# Patient Record
Sex: Male | Born: 1950 | Hispanic: No | Marital: Married | Smoking: Never smoker
Health system: Southern US, Community
[De-identification: ages and names within clinical notes are randomized; demographics above are authoritative.]

## PROBLEM LIST (undated history)

## (undated) DIAGNOSIS — C801 Malignant (primary) neoplasm, unspecified: Secondary | ICD-10-CM

## (undated) DIAGNOSIS — Z9889 Other specified postprocedural states: Secondary | ICD-10-CM

## (undated) DIAGNOSIS — M109 Gout, unspecified: Secondary | ICD-10-CM

## (undated) DIAGNOSIS — R9431 Abnormal electrocardiogram [ECG] [EKG]: Secondary | ICD-10-CM

## (undated) DIAGNOSIS — K8689 Other specified diseases of pancreas: Secondary | ICD-10-CM

## (undated) DIAGNOSIS — R0600 Dyspnea, unspecified: Secondary | ICD-10-CM

## (undated) DIAGNOSIS — T8149XA Infection following a procedure, other surgical site, initial encounter: Secondary | ICD-10-CM

## (undated) DIAGNOSIS — E440 Moderate protein-calorie malnutrition: Secondary | ICD-10-CM

## (undated) DIAGNOSIS — M1712 Unilateral primary osteoarthritis, left knee: Secondary | ICD-10-CM

## (undated) DIAGNOSIS — E7800 Pure hypercholesterolemia, unspecified: Secondary | ICD-10-CM

## (undated) DIAGNOSIS — K831 Obstruction of bile duct: Secondary | ICD-10-CM

## (undated) DIAGNOSIS — I4892 Unspecified atrial flutter: Secondary | ICD-10-CM

## (undated) DIAGNOSIS — Z8679 Personal history of other diseases of the circulatory system: Secondary | ICD-10-CM

## (undated) DIAGNOSIS — R0609 Other forms of dyspnea: Secondary | ICD-10-CM

## (undated) HISTORY — DX: Other specified postprocedural states: Z98.890

## (undated) HISTORY — DX: Dyspnea, unspecified: R06.00

## (undated) HISTORY — DX: Unilateral primary osteoarthritis, left knee: M17.12

## (undated) HISTORY — DX: Other forms of dyspnea: R06.09

## (undated) HISTORY — PX: PANCREAS SURGERY: SHX731

## (undated) HISTORY — DX: Abnormal electrocardiogram (ECG) (EKG): R94.31

## (undated) HISTORY — PX: OTHER SURGICAL HISTORY: SHX169

## (undated) HISTORY — PX: ERCP W/ SPHICTEROTOMY: SHX1523

## (undated) HISTORY — DX: Obstruction of bile duct: K83.1

## (undated) HISTORY — DX: Gout, unspecified: M10.9

## (undated) HISTORY — DX: Pure hypercholesterolemia, unspecified: E78.00

## (undated) HISTORY — DX: Infection following a procedure, other surgical site, initial encounter: T81.49XA

## (undated) HISTORY — DX: Unspecified atrial flutter: I48.92

## (undated) HISTORY — PX: PARTIAL GASTRECTOMY: SHX2172

## (undated) HISTORY — DX: Malignant (primary) neoplasm, unspecified: C80.1

## (undated) HISTORY — DX: Moderate protein-calorie malnutrition: E44.0

## (undated) HISTORY — DX: Personal history of other diseases of the circulatory system: Z86.79

## (undated) HISTORY — DX: Other specified diseases of pancreas: K86.89

---

## 2007-12-18 HISTORY — PX: ATRIAL FLUTTER ABLATION: SHX5733

## 2008-01-01 ENCOUNTER — Encounter: Admission: RE | Admit: 2008-01-01 | Discharge: 2008-01-01 | Payer: Self-pay | Admitting: Internal Medicine

## 2008-01-04 ENCOUNTER — Ambulatory Visit: Payer: Self-pay | Admitting: Internal Medicine

## 2008-01-04 LAB — CONVERTED CEMR LAB
Eosinophils Absolute: 0.1 10*3/uL (ref 0.0–0.7)
GFR calc Af Amer: 81 mL/min
GFR calc non Af Amer: 67 mL/min
HCT: 48.9 % (ref 39.0–52.0)
INR: 1 (ref 0.8–1.0)
MCHC: 33 g/dL (ref 30.0–36.0)
MCV: 90.3 fL (ref 78.0–100.0)
Monocytes Absolute: 0.9 10*3/uL (ref 0.1–1.0)
Platelets: 279 10*3/uL (ref 150–400)
Potassium: 4.4 meq/L (ref 3.5–5.1)
RDW: 13.3 % (ref 11.5–14.6)
Sodium: 140 meq/L (ref 135–145)

## 2008-01-15 ENCOUNTER — Observation Stay (HOSPITAL_COMMUNITY): Admission: RE | Admit: 2008-01-15 | Discharge: 2008-01-16 | Payer: Self-pay | Admitting: Internal Medicine

## 2008-01-15 ENCOUNTER — Encounter: Payer: Self-pay | Admitting: Cardiology

## 2008-01-15 ENCOUNTER — Ambulatory Visit: Payer: Self-pay | Admitting: Internal Medicine

## 2008-02-01 ENCOUNTER — Ambulatory Visit: Payer: Self-pay

## 2008-02-01 ENCOUNTER — Encounter: Payer: Self-pay | Admitting: Internal Medicine

## 2008-02-08 ENCOUNTER — Ambulatory Visit: Payer: Self-pay | Admitting: Internal Medicine

## 2008-06-23 ENCOUNTER — Ambulatory Visit: Payer: Self-pay | Admitting: Internal Medicine

## 2009-10-02 ENCOUNTER — Ambulatory Visit: Payer: Self-pay | Admitting: Internal Medicine

## 2011-01-01 NOTE — Discharge Summary (Signed)
Evan Thomas, Evan Thomas NO.:  [id number]   MEDICAL RECORD NO.:  [id number]          PATIENT TYPE:  INP   LOCATION:  6531                         FACILITY:  [facility]   PHYSICIAN:  Doylene Canning. Ladona Ridgel, MD    DATE OF BIRTH:  [birth year]   DATE OF ADMISSION:  01/15/2008  DATE OF DISCHARGE:  01/16/2008                               DISCHARGE SUMMARY   FINAL DIAGNOSES:  1. Discharging day #1 status post electrophysiology study,      radiofrequency, catheter ablation of atypical atrial flutter.  2. Possible tachycardia-mediated cardiomyopathy.   SECONDARY DIAGNOSIS:  Gout.   PROCEDURE:  1. Jan 15, 2008, transesophageal echocardiogram by Dr. Nona Dell.  The study showed ejection fraction 20-25%.  This was      possibly artificially low.  We will recheck in 2-3 weeks.  There is      an evidence of a small PFO.  There was no left atrial appendage      thrombus.  2. Jan 15, 2008, electrophysiology study, radiofrequency catheter      ablation of a typical atrial flutter with creation of a      cavotricuspid isthmus bidirectional block by Dr. Lewayne Bunting.   BRIEF HISTORY:  Evan Thomas is a 60 year old male who is referred by Dr.  Luanna Cole. Thomas for evaluation of atrial flutter.  He has noted lately  some exercise difficulties, but did not have incredible symptoms.  He  does feel at times as if his heart has been racing at a routine physical  examination.  Electrocardiogram showed atypical atrial flutter.  The  patient has had no chest pain or chest discomfort and no syncope.  The  patient denies peripheral edema.   HOSPITAL COURSE:  The patient presents electively on Jan 15, 2008.  The  patient has not been anticoagulated, and therefore, he underwent  transesophageal echocardiogram.  This study showed ejection fraction of  20-25%.  No left atrial appendage thrombus.  Evidence of small  PFO.   PLAN:  Plan going forward would be to reassess 2-D echocardiogram in 2-3  weeks and then schedule a followup appointment with Dr. Lewayne Bunting.  The patient discharging on enteric-coated aspirin 325 mg daily.  This is  new medication for him.  He is on no other medications.  If possible, he  will require Coreg in the future.  However, followup is at [facility], [address].  1. Echocardiogram, Monday June 15 at 8:30, and he will see Dr. Ladona Ridgel,      Monday, June 22, at 4:30.   LABORATORY STUDIES:  Since admission, complete blood count, hemoglobin  15.1, hematocrit 45.4, white cells of 6.5, and platelets of 268.  Serum  electrolytes, sodium 139, potassium is 4, chloride 109, carbonate 23,  BUN is 14, creatinine 1.15, and glucose is 109.  Protime has not been  taken.  The patient is not on Coumadin.      Maple Mirza, PA      Doylene Canning. Ladona Ridgel, MD  Electronically Signed    GM/MEDQ  D:  01/15/2008  T:  01/16/2008  Job:  [id number]   cc:   Evan Thomas, M.D.

## 2011-01-01 NOTE — Assessment & Plan Note (Signed)
[facility]                         ELECTROPHYSIOLOGY OFFICE NOTE   Evan, Thomas                       MRN:          [id number]  DATE:02/08/2008                            DOB:          Mar 15, 1951    Evan Thomas returns today for followup.  He is a very pleasant middle-  aged male with a history of atrial flutter, status post ablation several  weeks ago.  At that time, he had a persistent cough and dyspnea and TEE  demonstrated what appeared to be tachycardia-induced cardiomyopathy.  After successful ablation, the patient was discharged home.  His cough  soon resolved and repeat 2-D echo, which was done several days ago,  demonstrates normalization of his left ventricular systolic function.  He was noted to have mild left atrial dilatation and atrial septal  aneurysm.  He returns today for followup.  He denies chest pain.  He  denies shortness of breath and overall feels well.  He denies  palpitations.  He currently takes an aspirin 325 a day.   PHYSICAL EXAMINATION:  GENERAL:  He is a pleasant well-appearing middle-  aged man in no distress.  VITAL SIGNS:  Blood pressure was 130/96, the pulse 85 and regular, the  respirations were 18, and the weight was 225 pounds.  NECK:  No jugular distention.  LUNGS:  Clear bilaterally to auscultation.  No wheezes, rales, or  rhonchi were present.  CARDIOVASCULAR:  Regular rate and rhythm.  Normal S1 and S2.  No  murmurs, rubs, or gallops.  EXTREMITIES:  No edema.   EKG demonstrates sinus rhythm with normal axis intervals.   IMPRESSION:  1. Symptomatic atrial flutter.  2. Status post ablation.  3. Tachycardia-induced cardiomyopathy, now resolved.   DISCUSSION:  I have recommended careful, watchful waiting for Mr.  Thomas.  Overall, he is doing well.  Obviously, if his blood pressure  remains elevated, I would ask him to see his primary physician, Dr.  Lenord Thomas back in followup.  I will not plan to  see him back in the office  unless he has recurrence of his atrial arrhythmias.  I think probably  maintenance of aspirin at least in the short term would be warranted.     Doylene Canning. Ladona Ridgel, MD  Electronically Signed    GWT/MedQ  DD: 02/08/2008  DT: 02/09/2008  Job #: [id number]   cc:   Evan Thomas, M.D.

## 2011-01-01 NOTE — Letter (Signed)
Jan 04, 2008    Luanna Cole. Lenord Fellers, M.D.  [address], [place]  [place], [place] [address]   RE:  Evan Thomas, Evan Thomas  MRN:  [id number]  /  DOB:  July 08, 1951   Dear [name]:   Thank you for referring [name] for EP evaluation.  As you  know, he is a very pleasant middle-aged man who was recently found to  have atrial flutter with very mild symptoms despite a very rapid  ventricular rate.  His exam has been well characterized as has his  family history, social history, and review of systems.   I have discussed treatment options with the patient today.  The risks,  benefits, goals, and expectations of catheter ablation of his atrial  flutter have been discussed and he would like to proceed with this.  We  will get this scheduled with a transesophageal echo done prior to this.  I will keep you apprised as to how he does.  Thanks for referring Mr.  Mukherjee for EP evaluation.    Sincerely,      Doylene Canning. Ladona Ridgel, MD  Electronically Signed    GWT/MedQ  DD: 01/04/2008  DT: 01/04/2008  Job #: [id number]

## 2011-01-01 NOTE — Op Note (Signed)
NAMELONDELL, NOLL NO.:  [id number]   MEDICAL RECORD NO.:  [id number]          PATIENT TYPE:  INP   LOCATION:  2899                         FACILITY:  [facility]   PHYSICIAN:  Doylene Canning. Ladona Ridgel, MD    DATE OF BIRTH:  August 07, 1951   DATE OF PROCEDURE:  01/15/2008  DATE OF DISCHARGE:                               OPERATIVE REPORT   PROCEDURE PERFORMED:  Electrophysiologic studies and RF catheter  ablation of atrial flutter.   HISTORY:  The patient is a 60 year old male with a history of atrial  flutter diagnosed by Dr. Sharlet Salina and referred for evaluation.  He has no history of heart failure, hypertension, diabetes, or stroke  and is here now for catheter ablation of his atrial flutter.  Of note,  the patient's atrial flutter is not associated with palpitations, and  the patient had no idea that he was in atrial flutter with the exception  that he had some fatigue with exertion.   PROCEDURE:  After informed consent was obtained, the patient was taken  to the diagnostic EP lab in a fasting state.  After usual preparation  and draping, intravenous fentanyl and midazolam was given for sedation.  A 6-French hexapolar catheter was inserted percutaneously into the right  jugular vein under fluoroscopic guidance, and advanced to the coronary  sinus.  A 5-French quadripolar catheter was inserted percutaneously in  the right femoral vein and advanced to the His bundle region under  fluoroscopic guidance.  A 7-French, 20-pole halo catheter was inserted  percutaneously in the right femoral vein and advanced through the right  atrium under fluoroscopic guidance.  After measurement of the basic  intervals, mapping was carried out demonstrating typical  counterclockwise tricuspid annular reentrant atrial flutter.  Cycle  length of the flutter was 219 msec.  A 7-French quadripolar ablation  catheter was inserted into the right femoral vein and advanced into the  right atrium  where additional mapping was carried out.  Atrial flutter  isthmus was unusually large and anteriorly displaced.  A total of 8 RF  energy applications were delivered to the usual atrial flutter isthmus.  During the third RF energy application, atrial flutter was terminated,  and sinus rhythm was restored.  The patient underwent additional pacing  with demonstration of isthmus block during the fourth RF energy  application.  At this point, four bonus RF energy applications were  delivered, and the patient was observed for 45 minutes.  During this  time, there was no atrial flutter isthmus conduction with bidirectional  block persisting.  During this time, rapid ventricular pacing was  carried out from the RV apex demonstrating VA Wenckebach at 360 msec.  During rapid ventricular pacing, the atrial activation sequence was  midline and decremental.  Next, programmed ventricular stimulation was  carried out from the RV apex at a base drive cycle length of 540 msec.  This S1-S2 interval stepwise decreased from 400 msec down to 270 msec  where the retrograde AV node ERP was observed.  During programmed  ventricular stimulation, the atrial activation sequence was midline and  decremental.  Programmed atrial stimulation was carried out from the  coronary sinus at a base drive cycle length of 098 msec.  The S1-S2  interval was stepwise decreased down to less than 200 msec, where AV  node conduction persisted.  During programmed atrial stimulation, there  are no AH jumps, no echo beats, and no inducible SVT.  Rapid atrial  pacing was carried out from the coronary sinus down to 300 msec  demonstrating AV Wenckebach.  During rapid atrial pacing, the PR  interval was actually greater than the RR interval, but there was no  inducible SVT.  At this point, the catheters were removed, hemostasis  was assured, and the patient was returned to his room in satisfactory  condition.   COMPLICATIONS:  There  were no immediate complications.   RESULTS:  4A.  Baseline ECG.  The baseline ECG demonstrates atrial  flutter with rapid ventricular response.  4B.  Baseline intervals.  The HV interval was 58 msec.  QRS duration was  70 msec.  4C.  Rapid ventricular pacing.  Following ablation, rapid ventricular  pacing was carried out from the RV apex demonstrating a VA Wenckebach  cycle length of 360 msec.  During rapid ventricular pacing, the atrial  activation was midline and decremental.  4D.  Programmed ventricular stimulation.  Programmed ventricular  stimulation was carried out from the RV apex at base drive cycle length  of 119 msec.  The S1-S2 interval stepwise decreased down to 270 msec  where the retrograde AV node ERP was observed.  During programmed  ventricular stimulation, the atrial activation sequence was midline and  decremental.  4E.  Rapid atrial pacing.  The rapid atrial pacing was carried out  following ablation demonstrating an AV Wenckebach cycle length of 300  msec.  During rapid atrial pacing, there was no inducible arrhythmias.  Bidirectional block was demonstrated.  21F.  Programmed atrial stimulation.  Programmed atrial stimulation was  carried out from the coronary sinus and the high right atrium at base  drive cycle length of 147 msec.  The S1-S2 interval stepwise decreased  down to less than 200 msec where AV conduction persisted.  There is no  inducible SVT during programmed atrial stimulation.  4G.  Arrhythmias observed.  Atrial flutter initiation present at the time of EP study.  Duration was  sustained.  Termination was with catheter ablation, cycle length was 219  msec.  4H.  Mapping.  Mapping of atrial flutter isthmus demonstrated a large  atrial flutter isthmus, anteriorly displaced.  4I.  RF energy application.  A total of 8 RF energy applications were  delivered.  During the second RF application, there was termination of  flutter and restoration of sinus  rhythm.  During the fourth RF energy  application, there was isthmus block present.  Four bonus RF energy  applications were delivered.  .   CONCLUSION:  This demonstrates successful electrophysiologic study and  RF catheter ablation of typical atrial flutter with a total of 8 RF  energy applications resulting in termination of flutter, restoration of  sinus rhythm, creation of bidirectional block, and atrial flutter  isthmus.        Doylene Canning. Ladona Ridgel, MD  Electronically Signed     GWT/MEDQ  D:  01/15/2008  T:  01/16/2008  Job:  [id number]   cc:   Luanna Cole. Lenord Fellers, M.D.

## 2011-01-01 NOTE — Assessment & Plan Note (Signed)
[facility]                         ELECTROPHYSIOLOGY OFFICE NOTE   ANIELLO, CHRISTOPOULOS                       MRN:          [id number]  DATE:01/04/2008                            DOB:          1951/06/04    REFERRING PHYSICIAN:  Luanna Cole. Lenord Fellers, M.D.   [name] is referred today by Dr. Sharlet Salina for evaluation of  atrial flutter.  The patient is a very pleasant middle-aged male with a  history of palpitations for approximately 6 months.  He states that he  may have noted some increasing exercise difficulties but really had not  paid much attention to it.  He subsequently was found to have a rapid  heart rate, and an EKG demonstrates typical atrial flutter.  He has had  no chest pain and no syncope.  He is otherwise stable.  He denies  peripheral edema.   PAST MEDICAL HISTORY:  Notable for a very mild amount of gout.   He is on no medications.   SOCIAL HISTORY:  The patient recently has married, and he is from  [place] but lives in the [place].  He denies tobacco or ethanol  abuse.   FAMILY HISTORY:  Notable for his parents both being deceased, died in  their 59s, his father of an MI, his mother of cancer.   Additional review of systems is really unremarkable.  He has rare  palpitations.  He states that his energy level has gone down, and he  thinks that he is not particularly good condition physically.   PHYSICAL EXAM:  He is a pleasant well-appearing middle-aged man in no  acute distress.  The blood pressure today was 131/84.  The pulse was 110 and regular; the  respirations were 18, and the weight was 229 pounds.  HEENT:  Normocephalic, atraumatic.  Pupils equal and round.  Oropharynx  was moist.  Sclerae anicteric.  NECK:  No jugular distention.  There is no thyromegaly.  Trachea is  midline.  The carotids are 2+ symmetric.  LUNGS:  Clear bilaterally to auscultation.  No wheezes, rales or rhonchi  present.  There is no  increased work of breathing.  CARDIOVASCULAR:  Regular tachycardia with normal S1 and S2.  The PMI was  not enlarged; it was laterally displaced.  ABDOMINAL:  Soft, nontender.  There is no organomegaly.  Bowel sounds are present.  There is no  rebound or guarding.  EXTREMITIES:  No cyanosis, clubbing or edema.  The pulses were 2+ and  symmetric.  NEUROLOGIC:  Alert and oriented x3.  Cranial nerves intact.  Strength is  5/5 and symmetric.   EKG demonstrates atrial flutter with a 2:1 AV conduction.   IMPRESSION:  Typical atrial flutter (lone atrial flutter).   DISCUSSION:  I have discussed the treatment options with the patient.  The risks, benefits, goals, and expectations of electrophysiologic study  and catheter ablation have been discussed.  The patient will require  transesophageal echocardiogram to exclude a left atrial appendage  thrombus and probably aspirin thereafter.  I will plan on getting him  scheduled at his earliest possible convenient time.  Doylene Canning. Ladona Ridgel, MD  Electronically Signed    GWT/MedQ  DD: 01/04/2008  DT: 01/04/2008  Job #: [id number]   cc:   Luanna Cole. Lenord Fellers, M.D.

## 2011-05-15 LAB — PROTIME-INR
INR: 1
Prothrombin Time: 13.7

## 2011-05-15 LAB — BASIC METABOLIC PANEL
Calcium: 9
Creatinine, Ser: 1.15
GFR calc Af Amer: >60
GFR calc non Af Amer: >60
Glucose, Bld: 109 — ABNORMAL HIGH
Sodium: 139

## 2011-05-15 LAB — CBC
Hemoglobin: 15.1
RBC: 5.11
RDW: 14.3

## 2011-05-15 LAB — APTT: aPTT: 31

## 2012-06-26 ENCOUNTER — Other Ambulatory Visit: Payer: Self-pay | Admitting: Internal Medicine

## 2012-06-29 ENCOUNTER — Encounter: Payer: Self-pay | Admitting: Internal Medicine

## 2012-08-24 ENCOUNTER — Other Ambulatory Visit: Payer: Self-pay | Admitting: Internal Medicine

## 2012-08-24 DIAGNOSIS — Z0000 Encounter for general adult medical examination without abnormal findings: Secondary | ICD-10-CM

## 2012-08-24 DIAGNOSIS — Z125 Encounter for screening for malignant neoplasm of prostate: Secondary | ICD-10-CM

## 2012-08-24 LAB — COMPREHENSIVE METABOLIC PANEL
ALT: 19 U/L (ref 0–53)
AST: 19 U/L (ref 0–37)
Albumin: 4 g/dL (ref 3.5–5.2)
Alkaline Phosphatase: 70 U/L (ref 39–117)
Glucose, Bld: 92 mg/dL (ref 70–99)
Potassium: 4.2 mEq/L (ref 3.5–5.3)
Sodium: 140 mEq/L (ref 135–145)
Total Bilirubin: 0.5 mg/dL (ref 0.3–1.2)
Total Protein: 7 g/dL (ref 6.0–8.3)

## 2012-08-24 LAB — CBC WITH DIFFERENTIAL/PLATELET
Basophils Absolute: 0.1 10*3/uL (ref 0.0–0.1)
Basophils Relative: 1 % (ref 0–1)
Eosinophils Absolute: 0.2 10*3/uL (ref 0.0–0.7)
Hemoglobin: 15.2 g/dL (ref 13.0–17.0)
MCHC: 34.4 g/dL (ref 30.0–36.0)
Monocytes Relative: 13 % — ABNORMAL HIGH (ref 3–12)
Neutro Abs: 3.7 10*3/uL (ref 1.7–7.7)
Neutrophils Relative %: 56 % (ref 43–77)
Platelets: 275 10*3/uL (ref 150–400)

## 2012-08-24 LAB — PSA: PSA: 1.89 ng/mL (ref <=4.00)

## 2012-08-24 LAB — LIPID PANEL
LDL Cholesterol: 135 mg/dL — ABNORMAL HIGH (ref 0–99)
VLDL: 15 mg/dL (ref 0–40)

## 2012-08-25 ENCOUNTER — Encounter: Payer: Self-pay | Admitting: Internal Medicine

## 2012-08-25 ENCOUNTER — Ambulatory Visit (INDEPENDENT_AMBULATORY_CARE_PROVIDER_SITE_OTHER): Payer: BC Managed Care – PPO | Admitting: Internal Medicine

## 2012-08-25 VITALS — BP 118/82 | HR 80 | Temp 97.5°F | Ht 73.25 in | Wt 229.0 lb

## 2012-08-25 DIAGNOSIS — Z0000 Encounter for general adult medical examination without abnormal findings: Secondary | ICD-10-CM

## 2012-08-25 DIAGNOSIS — E785 Hyperlipidemia, unspecified: Secondary | ICD-10-CM

## 2012-08-25 DIAGNOSIS — M7710 Lateral epicondylitis, unspecified elbow: Secondary | ICD-10-CM

## 2012-08-25 DIAGNOSIS — M1712 Unilateral primary osteoarthritis, left knee: Secondary | ICD-10-CM

## 2012-08-25 DIAGNOSIS — Z8679 Personal history of other diseases of the circulatory system: Secondary | ICD-10-CM

## 2012-08-25 DIAGNOSIS — M7711 Lateral epicondylitis, right elbow: Secondary | ICD-10-CM

## 2012-08-25 DIAGNOSIS — Z9889 Other specified postprocedural states: Secondary | ICD-10-CM

## 2012-08-25 LAB — POCT URINALYSIS DIPSTICK
Glucose, UA: NEGATIVE
Nitrite, UA: NEGATIVE
Protein, UA: NEGATIVE
Spec Grav, UA: >=1.03
Urobilinogen, UA: NEGATIVE

## 2012-09-20 DIAGNOSIS — Z9889 Other specified postprocedural states: Secondary | ICD-10-CM

## 2012-09-20 DIAGNOSIS — Z8679 Personal history of other diseases of the circulatory system: Secondary | ICD-10-CM

## 2012-09-20 HISTORY — DX: Other specified postprocedural states: Z98.890

## 2012-09-20 HISTORY — DX: Personal history of other diseases of the circulatory system: Z86.79

## 2012-09-20 NOTE — Progress Notes (Signed)
  Subjective:    Patient ID: Evan Thomas, male    DOB: June 30, 1951, 62 y.o.   MRN: [id number]  HPI 62 year old [place] male married to Evan Thomas in today for health maintenance exam. History of atrial flutter with ablation May 2009. No known drug allergies. Fractured toe in the remote past. No other history of serious illnesses or accidents. Tetanus immunization given 2009.  Social history: One adult daughter from a previous marriage in good health. Patient does not smoke. Drinks wine. He is a Associate Professor. College education. Likes history and traveling.  Family history: Father died at age 63 of an MI. Mother died at age 86 of cancer. 2 brothers in good health. Both parents had hypertension.  Never had colonoscopy. Discussed today. He will consider it.  Has some issues with left knee arthritis since August. Some right arm pain consistent with tennis elbow.    Review of Systems  Constitutional: Negative.   HENT: Negative.   Eyes: Negative.   Respiratory: Negative.   Cardiovascular:       History of atrial flutter status post ablation 2009  Gastrointestinal: Negative.   Genitourinary: Negative.   Musculoskeletal:       Left knee pain. Right elbow pain  Neurological: Negative.   Hematological: Negative.   Psychiatric/Behavioral: Negative.        Objective:   Physical Exam  Vitals reviewed. Constitutional: He is oriented to person, place, and time. He appears well-developed and well-nourished. No distress.  HENT:  Head: Normocephalic and atraumatic.  Right Ear: External ear normal.  Left Ear: External ear normal.  Mouth/Throat: Oropharynx is clear and moist.  Eyes: Conjunctivae normal and EOM are normal. Pupils are equal, round, and reactive to light. Right eye exhibits no discharge. Left eye exhibits no discharge. No scleral icterus.  Neck: Neck supple. No JVD present. No thyromegaly present.  Cardiovascular: Normal rate, regular rhythm, normal heart sounds and  intact distal pulses.   No murmur heard. Pulmonary/Chest: Effort normal and breath sounds normal. No respiratory distress. He has no wheezes. He has no rales. He exhibits no tenderness.  Abdominal: Soft. He exhibits no distension and no mass. There is no tenderness. There is no rebound and no guarding.  Genitourinary: Prostate normal.  Musculoskeletal: Normal range of motion. He exhibits no edema.       Slight crepitus left knee. No effusion. Tender right lateral elbow.  Lymphadenopathy:    He has no cervical adenopathy.  Neurological: He is alert and oriented to person, place, and time. He has normal reflexes. No cranial nerve deficit. Coordination normal.  Skin: Skin is warm and dry. No rash noted. He is not diaphoretic.  Psychiatric: He has a normal mood and affect. His behavior is normal. Judgment and thought content normal.          Assessment & Plan:  Right lateral epicondylitis (tennis elbow)-needs tennis elbow splint if symptoms persist. Naproxen will help this as well.  Left knee arthritis-continue naproxen. If symptoms persist see orthopedist  Status post ablation for atrial flutter 2009-doing well  Hyperlipidemia-LDL 135. Recommend diet and exercise  Plan: Needs to consider screening colonoscopy. Return one year or as needed.

## 2012-09-20 NOTE — Patient Instructions (Addendum)
Diet exercise and lose weight. Continue naproxen for musculoskeletal pain. Had left knee pain persists see orthopedist. Return in one year.

## 2013-07-22 ENCOUNTER — Ambulatory Visit (INDEPENDENT_AMBULATORY_CARE_PROVIDER_SITE_OTHER): Payer: BC Managed Care – PPO | Admitting: Internal Medicine

## 2013-07-22 DIAGNOSIS — Z23 Encounter for immunization: Secondary | ICD-10-CM

## 2013-09-20 ENCOUNTER — Other Ambulatory Visit: Payer: BC Managed Care – PPO | Admitting: Internal Medicine

## 2013-09-20 DIAGNOSIS — Z0000 Encounter for general adult medical examination without abnormal findings: Secondary | ICD-10-CM

## 2013-09-20 DIAGNOSIS — Z125 Encounter for screening for malignant neoplasm of prostate: Secondary | ICD-10-CM

## 2013-09-20 DIAGNOSIS — Z130 Encounter for screening for diseases of the blood and blood-forming organs and certain disorders involving the immune mechanism: Secondary | ICD-10-CM

## 2013-09-20 DIAGNOSIS — Z13220 Encounter for screening for lipoid disorders: Secondary | ICD-10-CM

## 2013-09-20 LAB — CBC WITH DIFFERENTIAL/PLATELET
BASOS PCT: 1 % (ref 0–1)
Basophils Absolute: 0.1 10*3/uL (ref 0.0–0.1)
Eosinophils Absolute: 0.2 10*3/uL (ref 0.0–0.7)
Eosinophils Relative: 3 % (ref 0–5)
HEMATOCRIT: 46 % (ref 39.0–52.0)
HEMOGLOBIN: 15.6 g/dL (ref 13.0–17.0)
Lymphocytes Relative: 30 % (ref 12–46)
Lymphs Abs: 1.9 10*3/uL (ref 0.7–4.0)
MCH: 29.5 pg (ref 26.0–34.0)
MCHC: 33.9 g/dL (ref 30.0–36.0)
MCV: 87.1 fL (ref 78.0–100.0)
MONO ABS: 0.9 10*3/uL (ref 0.1–1.0)
MONOS PCT: 14 % — AB (ref 3–12)
NEUTROS ABS: 3.3 10*3/uL (ref 1.7–7.7)
Neutrophils Relative %: 52 % (ref 43–77)
Platelets: 259 10*3/uL (ref 150–400)
RBC: 5.28 MIL/uL (ref 4.22–5.81)
RDW: 14.9 % (ref 11.5–15.5)
WBC: 6.3 10*3/uL (ref 4.0–10.5)

## 2013-09-20 LAB — COMPREHENSIVE METABOLIC PANEL
ALBUMIN: 3.9 g/dL (ref 3.5–5.2)
ALK PHOS: 71 U/L (ref 39–117)
ALT: 18 U/L (ref 0–53)
AST: 19 U/L (ref 0–37)
BUN: 15 mg/dL (ref 6–23)
CALCIUM: 9.1 mg/dL (ref 8.4–10.5)
CHLORIDE: 105 meq/L (ref 96–112)
CO2: 27 meq/L (ref 19–32)
Creat: 1.13 mg/dL (ref 0.50–1.35)
GLUCOSE: 93 mg/dL (ref 70–99)
POTASSIUM: 4.3 meq/L (ref 3.5–5.3)
Sodium: 140 mEq/L (ref 135–145)
Total Bilirubin: 0.4 mg/dL (ref 0.2–1.2)
Total Protein: 6.8 g/dL (ref 6.0–8.3)

## 2013-09-20 LAB — LIPID PANEL
CHOLESTEROL: 196 mg/dL (ref 0–200)
HDL: 53 mg/dL (ref >39)
LDL Cholesterol: 132 mg/dL — ABNORMAL HIGH (ref 0–99)
TRIGLYCERIDES: 56 mg/dL (ref <150)
Total CHOL/HDL Ratio: 3.7 Ratio
VLDL: 11 mg/dL (ref 0–40)

## 2013-09-21 ENCOUNTER — Ambulatory Visit (INDEPENDENT_AMBULATORY_CARE_PROVIDER_SITE_OTHER): Payer: BC Managed Care – PPO | Admitting: Internal Medicine

## 2013-09-21 ENCOUNTER — Encounter: Payer: Self-pay | Admitting: Internal Medicine

## 2013-09-21 VITALS — BP 120/84 | HR 80 | Temp 97.6°F | Ht 73.0 in | Wt 236.0 lb

## 2013-09-21 DIAGNOSIS — E7800 Pure hypercholesterolemia, unspecified: Secondary | ICD-10-CM

## 2013-09-21 DIAGNOSIS — Z8679 Personal history of other diseases of the circulatory system: Secondary | ICD-10-CM

## 2013-09-21 DIAGNOSIS — Z0000 Encounter for general adult medical examination without abnormal findings: Secondary | ICD-10-CM

## 2013-09-21 DIAGNOSIS — M1712 Unilateral primary osteoarthritis, left knee: Secondary | ICD-10-CM

## 2013-09-21 DIAGNOSIS — IMO0002 Reserved for concepts with insufficient information to code with codable children: Secondary | ICD-10-CM

## 2013-09-21 DIAGNOSIS — M1710 Unilateral primary osteoarthritis, unspecified knee: Secondary | ICD-10-CM

## 2013-09-21 HISTORY — DX: Pure hypercholesterolemia, unspecified: E78.00

## 2013-09-21 HISTORY — DX: Unilateral primary osteoarthritis, left knee: M17.12

## 2013-09-21 LAB — POCT URINALYSIS DIPSTICK
BILIRUBIN UA: NEGATIVE
GLUCOSE UA: NEGATIVE
KETONES UA: NEGATIVE
Leukocytes, UA: NEGATIVE
Nitrite, UA: NEGATIVE
Protein, UA: NEGATIVE
RBC UA: NEGATIVE
Urobilinogen, UA: NEGATIVE
pH, UA: 5.5

## 2013-09-21 LAB — PSA: PSA: 1.76 ng/mL (ref <=4.00)

## 2013-09-21 NOTE — Progress Notes (Signed)
   Subjective:    Patient ID: Evan Thomas, male    DOB: December 15, 1950, 63 y.o.   MRN: [id number]  HPI Most pleasant 63 year old [place] male, [place] native,  married to Varney Baas for health maintenance exam. He has history of atrial flutter status post ablation may 2009.  No known drug allergies.  History of osteoarthritis and torn meniscus left knee. About a month and a half ago had injection left knee that did not seem to help symptoms. Not able to exercise much because of painful left knee. History of fractured toe in the remote past. No history of other serious illnesses or accidents.  Tetanus immunization 2009.  Never had colonoscopy. Discussed it today. He will consider it.  Family history: Father died at age 39 of an MI. Mother died at age 55 of cancer. 2 brothers in good health. Both parents had hypertension.  Social history: One daughter from a previous marriage in good health. Patient does not smoke. Drinks 9. He is a Arts development officer. Has college education. Likes history and traveling.    Review of Systems  Constitutional: Negative.   HENT: Negative.   Eyes: Negative.   Respiratory: Negative.   Cardiovascular: Negative.  Negative for chest pain.  Gastrointestinal: Negative.   Endocrine: Negative.   Musculoskeletal:       Left knee pain  Allergic/Immunologic: Negative.   Neurological: Negative.   Hematological: Negative.   Psychiatric/Behavioral: Negative.        Objective:   Physical Exam  Vitals reviewed. Constitutional: He is oriented to person, place, and time. He appears well-developed and well-nourished. No distress.  HENT:  Head: Normocephalic and atraumatic.  Right Ear: External ear normal.  Left Ear: External ear normal.  Mouth/Throat: No oropharyngeal exudate.  Eyes: Conjunctivae and EOM are normal. Pupils are equal, round, and reactive to light. Right eye exhibits no discharge. Left eye exhibits no discharge. No scleral icterus.  Neck: Neck  supple. No JVD present. No thyromegaly present.  Cardiovascular: Normal rate, regular rhythm, normal heart sounds and intact distal pulses.   No murmur heard. Pulmonary/Chest: Effort normal and breath sounds normal. No respiratory distress. He has no rales.  Abdominal: Soft. Bowel sounds are normal. He exhibits no distension and no mass. There is no tenderness. There is no rebound and no guarding.  Genitourinary: Prostate normal.  Musculoskeletal: Normal range of motion. He exhibits no edema.  Crepitus left knee  Lymphadenopathy:    He has no cervical adenopathy.  Neurological: He is alert and oriented to person, place, and time. He has normal reflexes. He displays normal reflexes. No cranial nerve deficit. He exhibits normal muscle tone. Coordination normal.  Skin: Skin is dry. No rash noted. He is not diaphoretic.  Psychiatric: He has a normal mood and affect. His behavior is normal. Judgment and thought content normal.          Assessment & Plan:   History of ablation for atrial flutter  Elevated LDL cholesterol  Osteoarthritis left knee  Plan: Discuss colonoscopy. These had Zostavax vaccine at pharmacy. Suggest he repeat fasting lipid panel in 6 months. Encouraged diet and exercise.

## 2013-09-21 NOTE — Patient Instructions (Signed)
Diet and exercise. Recheck lipid panel in 6 months. Otherwise return in one year. Colonoscopy discussed. He will let me know when he would like to go.

## 2014-07-19 ENCOUNTER — Ambulatory Visit (INDEPENDENT_AMBULATORY_CARE_PROVIDER_SITE_OTHER): Payer: BC Managed Care – PPO | Admitting: Internal Medicine

## 2014-07-19 ENCOUNTER — Encounter: Payer: Self-pay | Admitting: Internal Medicine

## 2014-07-19 VITALS — BP 120/82 | HR 88 | Temp 97.8°F | Ht 73.0 in | Wt 238.5 lb

## 2014-07-19 DIAGNOSIS — Z125 Encounter for screening for malignant neoplasm of prostate: Secondary | ICD-10-CM

## 2014-07-19 DIAGNOSIS — J209 Acute bronchitis, unspecified: Secondary | ICD-10-CM

## 2014-07-19 DIAGNOSIS — E7800 Pure hypercholesterolemia, unspecified: Secondary | ICD-10-CM

## 2014-07-19 DIAGNOSIS — Z130 Encounter for screening for diseases of the blood and blood-forming organs and certain disorders involving the immune mechanism: Secondary | ICD-10-CM

## 2014-07-19 DIAGNOSIS — Z13220 Encounter for screening for lipoid disorders: Secondary | ICD-10-CM

## 2014-07-19 DIAGNOSIS — Z0000 Encounter for general adult medical examination without abnormal findings: Secondary | ICD-10-CM

## 2014-07-19 DIAGNOSIS — H6693 Otitis media, unspecified, bilateral: Secondary | ICD-10-CM

## 2014-07-19 LAB — CBC WITH DIFFERENTIAL/PLATELET
Basophils Absolute: 0.1 10*3/uL (ref 0.0–0.1)
Basophils Relative: 1 % (ref 0–1)
EOS PCT: 2 % (ref 0–5)
Eosinophils Absolute: 0.1 10*3/uL (ref 0.0–0.7)
HCT: 44.6 % (ref 39.0–52.0)
HEMOGLOBIN: 15.7 g/dL (ref 13.0–17.0)
LYMPHS ABS: 1.6 10*3/uL (ref 0.7–4.0)
LYMPHS PCT: 21 % (ref 12–46)
MCH: 30 pg (ref 26.0–34.0)
MCHC: 35.2 g/dL (ref 30.0–36.0)
MCV: 85.3 fL (ref 78.0–100.0)
MONO ABS: 1 10*3/uL (ref 0.1–1.0)
MPV: 9.6 fL (ref 9.4–12.4)
Monocytes Relative: 13 % — ABNORMAL HIGH (ref 3–12)
NEUTROS ABS: 4.7 10*3/uL (ref 1.7–7.7)
Neutrophils Relative %: 63 % (ref 43–77)
Platelets: 296 10*3/uL (ref 150–400)
RBC: 5.23 MIL/uL (ref 4.22–5.81)
RDW: 14.3 % (ref 11.5–15.5)
WBC: 7.4 10*3/uL (ref 4.0–10.5)

## 2014-07-19 LAB — LIPID PANEL
CHOL/HDL RATIO: 3.9 ratio
CHOLESTEROL: 218 mg/dL — AB (ref 0–200)
HDL: 56 mg/dL (ref >39)
LDL Cholesterol: 145 mg/dL — ABNORMAL HIGH (ref 0–99)
Triglycerides: 87 mg/dL (ref <150)
VLDL: 17 mg/dL (ref 0–40)

## 2014-07-19 LAB — COMPREHENSIVE METABOLIC PANEL
ALBUMIN: 4 g/dL (ref 3.5–5.2)
ALT: 21 U/L (ref 0–53)
AST: 23 U/L (ref 0–37)
Alkaline Phosphatase: 75 U/L (ref 39–117)
BUN: 16 mg/dL (ref 6–23)
CALCIUM: 9.2 mg/dL (ref 8.4–10.5)
CHLORIDE: 104 meq/L (ref 96–112)
CO2: 26 meq/L (ref 19–32)
CREATININE: 1.1 mg/dL (ref 0.50–1.35)
Glucose, Bld: 94 mg/dL (ref 70–99)
POTASSIUM: 4.3 meq/L (ref 3.5–5.3)
Sodium: 138 mEq/L (ref 135–145)
Total Bilirubin: 0.7 mg/dL (ref 0.2–1.2)
Total Protein: 7.4 g/dL (ref 6.0–8.3)

## 2014-07-19 MED ORDER — BENZONATATE 100 MG PO CAPS: 200.0000 mg | ORAL_CAPSULE | Freq: Three times a day (TID) | ORAL | Status: DC | PRN

## 2014-07-19 MED ORDER — AMOXICILLIN 500 MG PO CAPS: 500.0000 mg | ORAL_CAPSULE | Freq: Three times a day (TID) | ORAL | Status: DC

## 2014-07-19 NOTE — Progress Notes (Signed)
   Subjective:    Patient ID: Evan Thomas, male    DOB: Feb 12, 1951, 63 y.o.   MRN: [id number]  HPI In today for blood pressure check and fasting lab work. Needs form completed for insurance purposes containing lab work. This will be done once results are back. Has physical exam scheduled for February 2016. He will be away in [place] for a couple of months after the first of the year looking for a house in [place]. He has come down with a respiratory infection. Has cough cannot get rid of and has had it for a few weeks. No sore throat cough shaking chills or fever    Review of Systems     Objective:   Physical Exam TMs are full bilaterally. Left TM is centrally injected. Pharynx slightly injected. Neck is supple without adenopathy. Chest clear to auscultation. Cardiac exam regular rate and rhythm.       Assessment & Plan:  Acute bilateral otitis media  Bronchitis  Status post ablation for atrial flutter  Plan: Amoxicillin 500 mg 3 times daily for 10 days. Tessalon Perles 3 times daily as needed for cough. Keep appointment February for physical exam. Fasting labs are drawn today and are pending.

## 2014-07-19 NOTE — Patient Instructions (Addendum)
Take Amoxicillin 500 mg 3 times daily for 10 days. Tessalon perles for cough.  Return in February for PE.

## 2014-07-20 LAB — PSA: PSA: 2 ng/mL (ref <=4.00)

## 2014-09-26 ENCOUNTER — Encounter: Payer: BC Managed Care – PPO | Admitting: Internal Medicine

## 2015-05-18 ENCOUNTER — Ambulatory Visit (INDEPENDENT_AMBULATORY_CARE_PROVIDER_SITE_OTHER): Payer: BLUE CROSS/BLUE SHIELD | Admitting: Internal Medicine

## 2015-05-18 ENCOUNTER — Encounter: Payer: Self-pay | Admitting: Internal Medicine

## 2015-05-18 VITALS — BP 108/74 | HR 81 | Temp 98.2°F | Ht 73.0 in | Wt 237.5 lb

## 2015-05-18 DIAGNOSIS — R059 Cough, unspecified: Secondary | ICD-10-CM

## 2015-05-18 DIAGNOSIS — R05 Cough: Secondary | ICD-10-CM

## 2015-05-18 DIAGNOSIS — J069 Acute upper respiratory infection, unspecified: Secondary | ICD-10-CM | POA: Diagnosis not present

## 2015-05-18 MED ORDER — AMOXICILLIN 500 MG PO CAPS: 500.0000 mg | ORAL_CAPSULE | Freq: Three times a day (TID) | ORAL | Status: DC

## 2015-05-18 NOTE — Patient Instructions (Addendum)
Amoxicillin 500 mg 3 times daily for 10 days. Call if not better after 10 days or sooner if worse

## 2015-05-19 NOTE — Progress Notes (Signed)
   Subjective:    Patient ID: Evan Thomas, male    DOB: 03-20-1951, 64 y.o.   MRN: [id number]  HPI  In with cough for several weeks. He's had no fever or shaking chills. Cough is really not productive. Just dry. Can't seem to get rid of it. Has been traveling some in [place].    Review of Systems     Objective:   Physical Exam  Skin warm and dry. Nodes none. Pharynx is slightly injected. TMs are slightly full bilaterally but not red. Neck is supple without adenopathy. Chest clear to auscultation.      Assessment & Plan:  Cough  Acute bilateral serous otitis media  Plan: Amoxicillin 500 mg 3 times daily for 10 days.

## 2015-08-22 ENCOUNTER — Other Ambulatory Visit: Payer: Self-pay | Admitting: Internal Medicine

## 2015-08-24 ENCOUNTER — Encounter: Payer: Self-pay | Admitting: Internal Medicine

## 2015-11-09 ENCOUNTER — Encounter: Payer: Self-pay | Admitting: Internal Medicine

## 2015-11-09 ENCOUNTER — Ambulatory Visit (INDEPENDENT_AMBULATORY_CARE_PROVIDER_SITE_OTHER): Payer: BLUE CROSS/BLUE SHIELD | Admitting: Internal Medicine

## 2015-11-09 VITALS — BP 132/86 | HR 80 | Temp 99.1°F | Resp 18 | Ht 73.0 in | Wt 240.0 lb

## 2015-11-09 DIAGNOSIS — J069 Acute upper respiratory infection, unspecified: Secondary | ICD-10-CM

## 2015-11-09 DIAGNOSIS — H6503 Acute serous otitis media, bilateral: Secondary | ICD-10-CM | POA: Diagnosis not present

## 2015-11-09 MED ORDER — BENZONATATE 100 MG PO CAPS: 200.0000 mg | ORAL_CAPSULE | Freq: Three times a day (TID) | ORAL | Status: DC

## 2015-11-09 MED ORDER — AMOXICILLIN 500 MG PO CAPS: 500.0000 mg | ORAL_CAPSULE | Freq: Three times a day (TID) | ORAL | Status: DC

## 2015-11-09 NOTE — Progress Notes (Signed)
   Subjective:    Patient ID: Evan Thomas, male    DOB: 06-27-1951, 65 y.o.   MRN: [id number]  HPI 65 year old [place] native in today with URI symptoms for about 3 weeks. He visited his grandson school and subsequently came down with respiratory infection. No fever or shaking chills. Has had cough and chest tightness. No significant sore throat or ear pain. Leaving tomorrow to go to [place] where he has recently purchased a home. No wheezing or significant shortness of breath. Was seen in September with similar illness treated with amoxicillin and Tessalon Perles. He did well.    Review of Systems see above     Objective:   Physical Exam  Skin warm and dry. Nodes none. Pharynx very slightly injected. TMs are clear. Neck is supple without adenopathy. Chest is clear to auscultation without rales or wheezing      Assessment & Plan:  Upper respiratory infection  Plan: Amoxicillin 500 mg 3 times daily for 10 days. Tessalon Perles 200 mg 3 times daily as needed for cough. Rest and drink plenty of fluids.

## 2015-11-09 NOTE — Patient Instructions (Signed)
Tessalon perles as directed. Amoxicillin as directed.

## 2016-09-27 ENCOUNTER — Other Ambulatory Visit: Payer: BLUE CROSS/BLUE SHIELD | Admitting: Internal Medicine

## 2016-09-27 DIAGNOSIS — E7800 Pure hypercholesterolemia, unspecified: Secondary | ICD-10-CM

## 2016-09-27 DIAGNOSIS — Z125 Encounter for screening for malignant neoplasm of prostate: Secondary | ICD-10-CM

## 2016-09-27 DIAGNOSIS — Z0000 Encounter for general adult medical examination without abnormal findings: Secondary | ICD-10-CM

## 2016-09-27 LAB — LIPID PANEL
CHOL/HDL RATIO: 4 ratio (ref <5.0)
Cholesterol: 193 mg/dL (ref <200)
HDL: 48 mg/dL (ref >40)
LDL CALC: 129 mg/dL — AB (ref <100)
Triglycerides: 81 mg/dL (ref <150)
VLDL: 16 mg/dL (ref <30)

## 2016-09-27 LAB — CBC WITH DIFFERENTIAL/PLATELET
BASOS ABS: 54 {cells}/uL (ref 0–200)
Basophils Relative: 1 %
EOS ABS: 108 {cells}/uL (ref 15–500)
EOS PCT: 2 %
HCT: 47.1 % (ref 38.5–50.0)
Hemoglobin: 15.9 g/dL (ref 13.2–17.1)
LYMPHS PCT: 29 %
Lymphs Abs: 1566 cells/uL (ref 850–3900)
MCH: 30 pg (ref 27.0–33.0)
MCHC: 33.8 g/dL (ref 32.0–36.0)
MCV: 88.9 fL (ref 80.0–100.0)
MONOS PCT: 10 %
MPV: 9.9 fL (ref 7.5–12.5)
Monocytes Absolute: 540 cells/uL (ref 200–950)
NEUTROS ABS: 3132 {cells}/uL (ref 1500–7800)
Neutrophils Relative %: 58 %
PLATELETS: 245 10*3/uL (ref 140–400)
RBC: 5.3 MIL/uL (ref 4.20–5.80)
RDW: 14 % (ref 11.0–15.0)
WBC: 5.4 10*3/uL (ref 3.8–10.8)

## 2016-09-27 LAB — COMPREHENSIVE METABOLIC PANEL
ALT: 17 U/L (ref 9–46)
AST: 24 U/L (ref 10–35)
Albumin: 3.9 g/dL (ref 3.6–5.1)
Alkaline Phosphatase: 62 U/L (ref 40–115)
BUN: 14 mg/dL (ref 7–25)
CHLORIDE: 106 mmol/L (ref 98–110)
CO2: 25 mmol/L (ref 20–31)
CREATININE: 1.06 mg/dL (ref 0.70–1.25)
Calcium: 9 mg/dL (ref 8.6–10.3)
GLUCOSE: 97 mg/dL (ref 65–99)
POTASSIUM: 4.7 mmol/L (ref 3.5–5.3)
SODIUM: 139 mmol/L (ref 135–146)
TOTAL PROTEIN: 7.2 g/dL (ref 6.1–8.1)
Total Bilirubin: 0.5 mg/dL (ref 0.2–1.2)

## 2016-09-27 LAB — PSA: PSA: 2.1 ng/mL (ref <=4.0)

## 2016-10-01 ENCOUNTER — Encounter: Payer: Self-pay | Admitting: Internal Medicine

## 2016-10-01 ENCOUNTER — Ambulatory Visit (INDEPENDENT_AMBULATORY_CARE_PROVIDER_SITE_OTHER): Payer: BLUE CROSS/BLUE SHIELD | Admitting: Internal Medicine

## 2016-10-01 VITALS — BP 128/90 | HR 72 | Ht 73.0 in | Wt 235.0 lb

## 2016-10-01 DIAGNOSIS — M1712 Unilateral primary osteoarthritis, left knee: Secondary | ICD-10-CM | POA: Diagnosis not present

## 2016-10-01 DIAGNOSIS — Z0000 Encounter for general adult medical examination without abnormal findings: Secondary | ICD-10-CM

## 2016-10-01 DIAGNOSIS — E7800 Pure hypercholesterolemia, unspecified: Secondary | ICD-10-CM

## 2016-10-01 LAB — POCT URINALYSIS DIPSTICK
Bilirubin, UA: NEGATIVE
GLUCOSE UA: NEGATIVE
Ketones, UA: NEGATIVE
Leukocytes, UA: NEGATIVE
NITRITE UA: NEGATIVE
PROTEIN UA: NEGATIVE
RBC UA: NEGATIVE
SPEC GRAV UA: 1.015
UROBILINOGEN UA: NEGATIVE
pH, UA: 7

## 2016-10-01 NOTE — Progress Notes (Signed)
   Subjective:    Patient ID: Evan Thomas, male    DOB: Sep 14, 1950, 66 y.o.   MRN: [id number]  HPI  66 year old [place] male, native of [place], married to Varney Baas for health maintenance exam. He has history of atrial flutter status post ablation May 2009.  No known drug allergies.  History of osteoarthritis and torn meniscus left knee. Has had injection in  Knee in the past. History of fractured toe in the remote past.  No other history of serious illnesses or accidents.  Tetanus immunization 2009.  Never had colonoscopy. This was discussed.  History of elevated LDL cholesterol. 2 years ago was 145 with total cholesterol of 218. Nail total cholesterol 193 with an LDL of 129. All other labs including PSA are within normal limits.  Family history: Father died at age 75 of an MI. Mother died at age 38 of cancer. 2 brothers in good health. Both parents had hypertension.  Social history: One daughter from a previous marriage in good health. Patient does not smoke. Drinks socially. 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 He likes history and traveling.      Review of Systems  Constitutional: Negative.   Respiratory: Negative.   Cardiovascular: Negative.   Gastrointestinal: Negative.   Musculoskeletal:       Left knee pain  Neurological: Negative.        Objective:   Physical Exam  Constitutional: He is oriented to person, place, and time. He appears well-developed and well-nourished. No distress.  HENT:  Head: Normocephalic and atraumatic.  Right Ear: External ear normal.  Left Ear: External ear normal.  Mouth/Throat: Oropharynx is clear and moist.  Eyes: Conjunctivae and EOM are normal. Pupils are equal, round, and reactive to light. Right eye exhibits no discharge. Left eye exhibits no discharge. No scleral icterus.  Neck: Neck supple. No JVD present. No thyromegaly present.  Cardiovascular: Normal rate, regular rhythm, normal heart sounds and  intact distal pulses.   No murmur heard. Pulmonary/Chest: He has no wheezes. He has no rales.  Abdominal: Soft. Bowel sounds are normal. He exhibits no distension and no mass. There is no tenderness. There is no rebound and no guarding.  Genitourinary: Prostate normal.  Musculoskeletal: He exhibits no edema.  Crepitus left knee  Lymphadenopathy:    He has no cervical adenopathy.  Neurological: He is alert and oriented to person, place, and time. He has normal reflexes. No cranial nerve deficit. Coordination normal.  Skin: Skin is warm and dry. No rash noted. He is not diaphoretic.  Psychiatric: He has a normal mood and affect. His behavior is normal. Judgment and thought content normal.  Vitals reviewed.         Assessment & Plan:   Osteoarthritis left knee  Hyperlipidemia  Status post cardiac ablation for atrial flutter 2009  Plan: Continue to work on diet and exercise. He travels to [place] frequently. Recommend colonoscopy. Return in one year or as needed.

## 2016-10-14 NOTE — Patient Instructions (Signed)
It was a pleasure to see you today. Continue to work On diet and exercise and return in one year.

## 2017-05-29 ENCOUNTER — Ambulatory Visit: Payer: BLUE CROSS/BLUE SHIELD | Admitting: Internal Medicine

## 2017-06-03 ENCOUNTER — Ambulatory Visit (INDEPENDENT_AMBULATORY_CARE_PROVIDER_SITE_OTHER): Payer: BLUE CROSS/BLUE SHIELD | Admitting: Internal Medicine

## 2017-06-03 DIAGNOSIS — Z23 Encounter for immunization: Secondary | ICD-10-CM | POA: Diagnosis not present

## 2017-06-03 NOTE — Progress Notes (Signed)
Flu shot given

## 2017-06-03 NOTE — Patient Instructions (Signed)
Flu shot given

## 2018-04-28 ENCOUNTER — Encounter: Payer: Self-pay | Admitting: Internal Medicine

## 2018-04-28 ENCOUNTER — Ambulatory Visit (INDEPENDENT_AMBULATORY_CARE_PROVIDER_SITE_OTHER): Payer: Medicare Other | Admitting: Internal Medicine

## 2018-04-28 DIAGNOSIS — J22 Unspecified acute lower respiratory infection: Secondary | ICD-10-CM

## 2018-04-28 DIAGNOSIS — Z23 Encounter for immunization: Secondary | ICD-10-CM | POA: Diagnosis not present

## 2018-04-28 NOTE — Patient Instructions (Addendum)
Doxycycline 100 mg bid x 10 days. [facility] [place] [facility]. Called to [employer] and [employer].

## 2018-04-28 NOTE — Progress Notes (Signed)
   Subjective:    Patient ID: Evan Thomas, male    DOB: 1950/12/23, 67 y.o.   MRN: [id number]  HPI Pt just returned from his home in [place] after a 6 month stay. Has had recurrent cough for some time.Thinks he may have allergies.Had cough in Sept 2016 and March 2017. Seen ere infrequently last in Feb 2018. Last CXR was May 2009 and was WNL. He would like to be allergy tested.  No fever sore throat or chills. Cough is mostly dry.    Review of Systems see above     Objective:   Physical Exam Skin: warm and dry.TMs clear. Pharynx clear. Neck supple. Chest clear without rales or wheezing.       Assessment & Plan:  Acute lower respiratory respiratory infection  Plan: referral for allergy testing. Doxycycline 100 mg bid x 10days

## 2018-04-29 MED ORDER — DOXYCYCLINE HYCLATE 100 MG PO TABS: 100.0000 mg | ORAL_TABLET | Freq: Two times a day (BID) | ORAL | 0 refills | Status: DC

## 2018-04-29 NOTE — Addendum Note (Signed)
Addended by: Mady Haagensen on: 04/29/2018 02:16 PM   Modules accepted: Orders

## 2018-05-13 ENCOUNTER — Other Ambulatory Visit: Payer: Self-pay | Admitting: Internal Medicine

## 2018-05-13 DIAGNOSIS — E7800 Pure hypercholesterolemia, unspecified: Secondary | ICD-10-CM

## 2018-05-15 ENCOUNTER — Other Ambulatory Visit: Payer: Medicare Other | Admitting: Internal Medicine

## 2018-05-15 DIAGNOSIS — E7800 Pure hypercholesterolemia, unspecified: Secondary | ICD-10-CM | POA: Diagnosis not present

## 2018-05-15 DIAGNOSIS — Z125 Encounter for screening for malignant neoplasm of prostate: Secondary | ICD-10-CM | POA: Diagnosis not present

## 2018-05-15 DIAGNOSIS — Z0000 Encounter for general adult medical examination without abnormal findings: Secondary | ICD-10-CM

## 2018-05-18 ENCOUNTER — Other Ambulatory Visit: Payer: Medicare Other | Admitting: Internal Medicine

## 2018-05-18 DIAGNOSIS — Z0000 Encounter for general adult medical examination without abnormal findings: Secondary | ICD-10-CM

## 2018-05-18 DIAGNOSIS — F458 Other somatoform disorders: Secondary | ICD-10-CM | POA: Diagnosis not present

## 2018-05-18 LAB — ADD ON CMP
AG RATIO: 1.2 (calc) (ref 1.0–2.5)
ALKALINE PHOSPHATASE (APISO): 67 U/L (ref 40–115)
ALT: 13 U/L (ref 9–46)
AST: 16 U/L (ref 10–35)
Albumin: 4.1 g/dL (ref 3.6–5.1)
BILIRUBIN TOTAL: 0.4 mg/dL (ref 0.2–1.2)
BUN: 19 mg/dL (ref 7–25)
CHLORIDE: 105 mmol/L (ref 98–110)
CO2: 18 mmol/L — ABNORMAL LOW (ref 20–32)
Calcium: 9.3 mg/dL (ref 8.6–10.3)
Creat: 1.16 mg/dL (ref 0.70–1.25)
GFR, Est African American: 75 mL/min/{1.73_m2} (ref >=60)
GFR, Est Non African American: 65 mL/min/{1.73_m2} (ref >=60)
Globulin: 3.3 g/dL (calc) (ref 1.9–3.7)
Glucose, Bld: 101 mg/dL — ABNORMAL HIGH (ref 65–99)
POTASSIUM: 4.4 mmol/L (ref 3.5–5.3)
Sodium: 142 mmol/L (ref 135–146)
Total Protein: 7.4 g/dL (ref 6.1–8.1)

## 2018-05-18 LAB — CBC WITH DIFFERENTIAL/PLATELET
Basophils Absolute: 79 cells/uL (ref 0–200)
Basophils Relative: 1.1 %
EOS ABS: 122 {cells}/uL (ref 15–500)
Eosinophils Relative: 1.7 %
HCT: 46.3 % (ref 38.5–50.0)
Hemoglobin: 15.9 g/dL (ref 13.2–17.1)
Lymphs Abs: 1526 cells/uL (ref 850–3900)
MCH: 29.9 pg (ref 27.0–33.0)
MCHC: 34.3 g/dL (ref 32.0–36.0)
MCV: 87 fL (ref 80.0–100.0)
MPV: 10.2 fL (ref 7.5–12.5)
Monocytes Relative: 11.9 %
NEUTROS PCT: 64.1 %
Neutro Abs: 4615 cells/uL (ref 1500–7800)
PLATELETS: 287 10*3/uL (ref 140–400)
RBC: 5.32 10*6/uL (ref 4.20–5.80)
RDW: 13.6 % (ref 11.0–15.0)
TOTAL LYMPHOCYTE: 21.2 %
WBC: 7.2 10*3/uL (ref 3.8–10.8)
WBCMIX: 857 {cells}/uL (ref 200–950)

## 2018-05-18 LAB — LIPID PANEL
CHOLESTEROL: 216 mg/dL — AB (ref <200)
HDL: 52 mg/dL (ref >40)
LDL Cholesterol (Calc): 145 mg/dL (calc) — ABNORMAL HIGH
NON-HDL CHOLESTEROL (CALC): 164 mg/dL — AB (ref <130)
Total CHOL/HDL Ratio: 4.2 (calc) (ref <5.0)
Triglycerides: 88 mg/dL (ref <150)

## 2018-05-18 LAB — TEST AUTHORIZATION

## 2018-05-18 LAB — PSA: PSA: 2.5 ng/mL (ref <=4.0)

## 2018-05-18 NOTE — Addendum Note (Signed)
Addended by: Mady Haagensen on: 05/18/2018 09:31 AM   Modules accepted: Orders

## 2018-05-19 ENCOUNTER — Ambulatory Visit (INDEPENDENT_AMBULATORY_CARE_PROVIDER_SITE_OTHER): Payer: Medicare Other | Admitting: Internal Medicine

## 2018-05-19 ENCOUNTER — Encounter: Payer: Self-pay | Admitting: Internal Medicine

## 2018-05-19 ENCOUNTER — Ambulatory Visit
Admission: RE | Admit: 2018-05-19 | Discharge: 2018-05-19 | Disposition: A | Discharge status: Home / Self Care | Payer: Medicare Other | Source: Ambulatory Visit | Attending: Internal Medicine | Admitting: Internal Medicine

## 2018-05-19 VITALS — BP 102/60 | HR 88 | Ht 73.0 in | Wt 244.0 lb

## 2018-05-19 DIAGNOSIS — R05 Cough: Secondary | ICD-10-CM

## 2018-05-19 DIAGNOSIS — E7800 Pure hypercholesterolemia, unspecified: Secondary | ICD-10-CM | POA: Diagnosis not present

## 2018-05-19 DIAGNOSIS — J22 Unspecified acute lower respiratory infection: Secondary | ICD-10-CM

## 2018-05-19 DIAGNOSIS — Z23 Encounter for immunization: Secondary | ICD-10-CM

## 2018-05-19 DIAGNOSIS — R059 Cough, unspecified: Secondary | ICD-10-CM

## 2018-05-19 DIAGNOSIS — Z0000 Encounter for general adult medical examination without abnormal findings: Secondary | ICD-10-CM

## 2018-05-19 LAB — POCT URINALYSIS DIPSTICK
Appearance: NORMAL
Bilirubin, UA: NEGATIVE
GLUCOSE UA: NEGATIVE
Ketones, UA: NEGATIVE
LEUKOCYTES UA: NEGATIVE
NITRITE UA: NEGATIVE
Odor: NORMAL
PROTEIN UA: NEGATIVE
RBC UA: NEGATIVE
Spec Grav, UA: 1.01 (ref 1.010–1.025)
Urobilinogen, UA: 0.2 E.U./dL
pH, UA: 6.5 (ref 5.0–8.0)

## 2018-05-19 MED ORDER — AMOXICILLIN 500 MG PO CAPS: 500.0000 mg | ORAL_CAPSULE | Freq: Three times a day (TID) | ORAL | 0 refills | Status: DC

## 2018-05-19 NOTE — Progress Notes (Signed)
Subjective:    Patient ID: Evan Thomas, male    DOB: [birth year], 67 y.o.   MRN: [id number]  HPI 67 year old Male for Welcome to Medicare physical examination and evaluation of medical issues.  He has a protracted cough that he has had for some time.  Until recently was spending time in [place]  He and his wife  also have a home here. Wife is retired from a brokerage firm.  Cough was treated September 11 with doxycycline.  He still has some cough and will be given amoxicillin 500 mg 3 times a day.  Chest x-ray will be obtained and he will be referred to an allergist.  Given pneumococcal 13 vaccine today as well as Tdap vaccine.  History of atrial flutter status post ablation 2009.  History of osteoarthritis and torn meniscus left knee.  He has had injection in the knee in the past.  History of fractured toe in the remote past.  No known drug allergies.  No other history of serious illnesses accidents or operations.  Family history: Father died at age 36 of an MI.  Mother died at age 63 of cancer.  2 brothers in good health.  Both parents had hypertension.  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  He likes history and traveling.  He does not smoke.  Drinks socially.  One daughter from previous marriage in good health.          Review of Systems no fever or chills.  Cough is productive at times.  He travels internationally.     Objective:   Physical Exam  Constitutional: He is oriented to person, place, and time. He appears well-nourished. No distress.  HENT:  Head: Normocephalic and atraumatic.  Right Ear: External ear normal.  Left Ear: External ear normal.  Mouth/Throat: Oropharynx is clear and moist. No oropharyngeal exudate.  Eyes: Pupils are equal, round, and reactive to light. Conjunctivae and EOM are normal. Right eye exhibits no discharge. Left eye exhibits no discharge.  Neck: Neck supple. No JVD present. No thyromegaly  present.  Cardiovascular: Normal rate, regular rhythm and normal heart sounds.  No murmur heard. Pulmonary/Chest: Effort normal and breath sounds normal. No stridor. No respiratory distress. He has no wheezes. He has no rales.  Abdominal: Soft. Bowel sounds are normal. He exhibits no distension and no mass. There is no tenderness. There is no rebound and no guarding.  Genitourinary: Prostate normal.  Musculoskeletal: He exhibits no edema.  Lymphadenopathy:    He has no cervical adenopathy.  Neurological: He is alert and oriented to person, place, and time.  Skin: Skin is warm and dry. He is not diaphoretic.  Psychiatric: He has a normal mood and affect. His behavior is normal. Judgment and thought content normal.  Vitals reviewed.         Assessment & Plan:  Protracted cough-?  Lower respiratory infection versus allergic rhinitis-will be referred to allergist  History of atrial flutter status post ablation 2009  Osteoarthritis left knee with history of torn meniscus  Elevated LDL at 145 with total cholesterol of 216.  He does not want to be on statin medication.  Needs to work on diet and exercise.  Recheck in 6 months.  Plan: Referral to allergist.  Chest x-ray to be obtained.  Try amoxicillin 500 mg 3 times a day for 10 days which he has had previously and thought it worked pretty well.  Doxycycline did not  clear up his cough.  Subjective:   Patient presents for Medicare Annual/Subsequent preventive examination.  Review Past Medical/Family/Social: See above   Risk Factors  Current exercise habits: Walks some, travels a lot Dietary issues discussed: Low-fat low carbohydrate  Cardiac risk factors: Hyperlipidemia and family history  Depression Screen  (Note: if answer to either of the following is "Yes", a more complete depression screening is indicated)   Over the past two weeks, have you felt down, depressed or hopeless? No  Over the past two weeks, have you felt little  interest or pleasure in doing things? No Have you lost interest or pleasure in daily life? No Do you often feel hopeless? No Do you cry easily over simple problems? No   Activities of Daily Living  In your present state of health, do you have any difficulty performing the following activities?:   Driving? No  Managing money? No  Feeding yourself? No  Getting from bed to chair? No  Climbing a flight of stairs? No  Preparing food and eating?: No  Bathing or showering? No  Getting dressed: No  Getting to the toilet? No  Using the toilet:No  Moving around from place to place: No  In the past year have you fallen or had a near fall?:No  Are you sexually active? No  Do you have more than one partner? No   Hearing Difficulties: No  Do you often ask people to speak up or repeat themselves? No  Do you experience ringing or noises in your ears? No  Do you have difficulty understanding soft or whispered voices? No  Do you feel that you have a problem with memory? No Do you often misplace items? No    Home Safety:  Do you have a smoke alarm at your residence? Yes Do you have grab bars in the bathroom?  No Do you have throw rugs in your house?  No   Cognitive Testing  Alert? Yes Normal Appearance?Yes  Oriented to person? Yes Place? Yes  Time? Yes  Recall of three objects? Yes  Can perform simple calculations? Yes  Displays appropriate judgment?Yes  Can read the correct time from a watch face?Yes   List the Names of Other Physician/Practitioners you currently use:  See referral list for the physicians patient is currently seeing.   No others at present   Review of Systems: See above   Objective:     General appearance: Appears stated age and mildly obese  Head: Normocephalic, without obvious abnormality, atraumatic  Eyes: conj clear, EOMi PEERLA  Ears: normal TM's and external ear canals both ears  Nose: Nares normal. Septum midline. Mucosa normal. No drainage or sinus  tenderness.  Throat: lips, mucosa, and tongue normal; teeth and gums normal  Neck: no adenopathy, no carotid bruit, no JVD, supple, symmetrical, trachea midline and thyroid not enlarged, symmetric, no tenderness/mass/nodules  No CVA tenderness.  Lungs: clear to auscultation bilaterally  Breasts: normal appearance, no masses or tenderness Heart: regular rate and rhythm, S1, S2 normal, no murmur, click, rub or gallop  Abdomen: soft, non-tender; bowel sounds normal; no masses, no organomegaly  Musculoskeletal: ROM normal in all joints, no crepitus, no deformity, Normal muscle strengthen. Back  is symmetric, no curvature. Skin: Skin color, texture, turgor normal. No rashes or lesions  Lymph nodes: Cervical, supraclavicular, and axillary nodes normal.  Neurologic: CN 2 -12 Normal, Normal symmetric reflexes. Normal coordination and gait  Psych: Alert & Oriented x 3, Mood appear stable.  Assessment:    Annual wellness medicare exam   Plan:    During the course of the visit the patient was educated and counseled about appropriate screening and preventive services including:   Colonoscopy discussed     Patient Instructions (the written plan) was given to the patient.  Medicare Attestation  I have personally reviewed:  The patient's medical and social history  Their use of alcohol, tobacco or illicit drugs  Their current medications and supplements  The patient's functional ability including ADLs,fall risks, home safety risks, cognitive, and hearing and visual impairment  Diet and physical activities  Evidence for depression or mood disorders  The patient's weight, height, BMI, and visual acuity have been recorded in the chart. I have made referrals, counseling, and provided education to the patient based on review of the above and I have provided the patient with a written personalized care plan for preventive services.

## 2018-06-06 NOTE — Patient Instructions (Signed)
Referral to allergist.  Chest x-ray performed and was negative.  Take amoxicillin 500 mg 3 times a day for 10 days.  Work on diet exercise and weight loss.  Watch fats in diet.  Follow-up with lipid panel and office visit in 6 months.  Tdap and Prevnar 13 given.

## 2018-09-21 ENCOUNTER — Telehealth: Payer: Self-pay | Admitting: Internal Medicine

## 2018-09-21 ENCOUNTER — Ambulatory Visit
Admission: RE | Admit: 2018-09-21 | Discharge: 2018-09-21 | Disposition: A | Discharge status: Home / Self Care | Payer: Medicare Other | Source: Ambulatory Visit | Attending: Internal Medicine | Admitting: Internal Medicine

## 2018-09-21 ENCOUNTER — Encounter: Payer: Self-pay | Admitting: Internal Medicine

## 2018-09-21 ENCOUNTER — Ambulatory Visit (INDEPENDENT_AMBULATORY_CARE_PROVIDER_SITE_OTHER): Payer: Medicare Other | Admitting: Internal Medicine

## 2018-09-21 VITALS — BP 110/80 | HR 88 | Temp 98.6°F | Ht 73.0 in | Wt 232.0 lb

## 2018-09-21 DIAGNOSIS — D378 Neoplasm of uncertain behavior of other specified digestive organs: Secondary | ICD-10-CM | POA: Diagnosis not present

## 2018-09-21 DIAGNOSIS — R110 Nausea: Secondary | ICD-10-CM

## 2018-09-21 DIAGNOSIS — R8290 Unspecified abnormal findings in urine: Secondary | ICD-10-CM | POA: Diagnosis not present

## 2018-09-21 DIAGNOSIS — R109 Unspecified abdominal pain: Secondary | ICD-10-CM

## 2018-09-21 DIAGNOSIS — K831 Obstruction of bile duct: Secondary | ICD-10-CM

## 2018-09-21 DIAGNOSIS — R822 Biliuria: Secondary | ICD-10-CM

## 2018-09-21 DIAGNOSIS — K8689 Other specified diseases of pancreas: Secondary | ICD-10-CM | POA: Diagnosis not present

## 2018-09-21 DIAGNOSIS — K869 Disease of pancreas, unspecified: Secondary | ICD-10-CM | POA: Diagnosis not present

## 2018-09-21 DIAGNOSIS — K5730 Diverticulosis of large intestine without perforation or abscess without bleeding: Secondary | ICD-10-CM | POA: Diagnosis not present

## 2018-09-21 DIAGNOSIS — Z789 Other specified health status: Secondary | ICD-10-CM | POA: Diagnosis not present

## 2018-09-21 HISTORY — DX: Obstruction of bile duct: K83.1

## 2018-09-21 LAB — POCT URINALYSIS DIPSTICK
APPEARANCE: NEGATIVE
Blood, UA: NEGATIVE
Glucose, UA: NEGATIVE
Ketones, UA: NEGATIVE
Leukocytes, UA: NEGATIVE
NITRITE UA: NEGATIVE
ODOR: NEGATIVE
PH UA: 5 (ref 5.0–8.0)
Protein, UA: POSITIVE — AB
Spec Grav, UA: 1.025 (ref 1.010–1.025)
UROBILINOGEN UA: 0.2 U/dL

## 2018-09-21 MED ORDER — IOPAMIDOL (ISOVUE-300) INJECTION 61%
100.0000 mL | Freq: Once | INTRAVENOUS | Status: AC | PRN
  Administered 2018-09-21: 100 mL via INTRAVENOUS

## 2018-09-21 MED ORDER — ONDANSETRON HCL 4 MG PO TABS: 4.0000 mg | ORAL_TABLET | Freq: Three times a day (TID) | ORAL | 0 refills | Status: AC | PRN

## 2018-09-21 NOTE — Patient Instructions (Signed)
CT of the abdomen and pelvis indicate patient has a mass at the head of the pancreas.  He has obstructive jaundice based on lab work and findings on CT.  Have discussed with patient and wife options for further evaluation and treatment.

## 2018-09-21 NOTE — Telephone Encounter (Signed)
Patient was seen today and had abnormal CT Abdomen/Pelvis.  Dx: Obstructive Jaundice and Pancreatic Mass.  [facility] @ [phone]; spoke with Dorothea Ogle.   Appointment for 09/23/18 @ 2:30 p.m. with Dr. Donnal Moat (Surgeon).  Fax [phone].  Notes/Demographics/Labs/Imaging has been faxed to them.    Called patient to notify him of appointment; spoke with wife, Varney Baas.  She wrote down all of the information.  She is to call [facility] and ask for a CD of today's CT Scan.  They  MUST bring the CD with them on Wednesday.  Provided her with phone #.    Address at [facility] - Dr. Donnal Moat [place], [place]  [id number]

## 2018-09-21 NOTE — Progress Notes (Signed)
Subjective:    Patient ID: Evan Thomas, male    DOB: September 02, 1950, 68 y.o.   MRN: [id number]  HPI  68 year old Male presents with complaint of dark urine and pale stools. About 12 days ago, he had some epigastric discomfort with some reflux type symptoms.  This was at night.  He has noticed some pale stools and has noticed his urine has become darker.  He has no fever or shaking chills.  No vomiting.  No significant nausea may be very slight but is able to eat without difficulty or pain.  He had physical exam here in October 2019.  He was treated for cough at that time with amoxicillin.  His cholesterol was elevated to 16 with an LDL cholesterol of 145 and C met was normal with the exception of a CO2 of 18.  Family history: Father died at age 8 of an MI.  Mother died at age 37 of cancer.  2 brothers healthy.  Social history: He is married and lives here in [place].  He is a native of [place] and has homes both here and in [place].  Wife recently retired from a Oncologist.  Both of them have been married before.  No children with this marriage.  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  Does not smoke.  Social alcohol consumption.  One daughter from previous marriage in good health.  History of atrial flutter status post ablation 2009.  No known drug allergies  History of osteoarthritis and torn meniscus left knee.  Dipstick UA is positive for protein, it is brown and cloudy with large bilirubin.  Specific gravity 1.025.  No LE or nitrite.  No blood.  No ketones.    Review of Systems see above.  Has begun to have some itching recently.  Has had some diarrhea recently.  No blood in stool.  Thinks he may have had some vague abdominal discomfort a few weeks ago.     Objective:   Physical Exam He has a slight yellow-tinge to his face.  Sclerae are not clearly icteric may be just a slight hint of yellow.  Skin is warm and dry.  Nodes none.  Neck is supple.   Chest clear.  Cardiac exam regular rate and rhythm.  Abdomen bowel sounds are active.  Abdomen is soft nondistended without hepatosplenomegaly and no palpable masses at the present time  CT of abdomen shows dilatation of the pancreatic duct with abrupt cut off and a subtle hypodensity of the pancreatic head near the ampulla.  No enlarged abdominal or pelvic lymph nodes.  Adrenal glands are unremarkable.  Kidneys are unremarkable.  Abrupt cut off noted at the head of the pancreas and a subtle hypodensity at the pancreatic head near the ampulla highly concerning for pancreatic malignancy.  CBC is normal.  Total bilirubin is 7.2, alkaline phosphatase 279, SGOT 111, SGPT 296.  Lipase is 666 and amylase is 205.         Assessment & Plan:  Obstructive jaundice due to mass near at head of pancreas-concern for pancreatic malignancy.  Have added CA 19-9.  Pancreatitis-has elevated amylase and lipase in the setting of mass at head of pancreas.  Have spoken with patient and his wife in person this afternoon after these results were obtained and discussed the results, the implications of the results, answered their questions and asked him to consider options for further evaluation.  We discussed various options today.  We are  going to order Zofran in case he needs something for nausea.  He is to avoid fried or fatty foods since he clearly has pancreatitis associated with this mass.  If he has intractable vomiting, he will need IV fluids and antinausea nausea medication through the emergency department.  Over 30 minutes spent with patient this morning with evaluation and treatment and again with he and his wife this afternoon answering questions and interpreting lab results and x-ray results.

## 2018-09-22 LAB — URINALYSIS, MICROSCOPIC ONLY
Bacteria, UA: NONE SEEN /HPF
Hyaline Cast: NONE SEEN /LPF
Squamous Epithelial / HPF: NONE SEEN /HPF (ref <=5)
WBC, UA: NONE SEEN /HPF (ref 0–5)

## 2018-09-22 LAB — URINE CULTURE
MICRO NUMBER:: 141533
RESULT: NO GROWTH
SPECIMEN QUALITY:: ADEQUATE

## 2018-09-23 DIAGNOSIS — K831 Obstruction of bile duct: Secondary | ICD-10-CM | POA: Diagnosis not present

## 2018-09-23 DIAGNOSIS — K8689 Other specified diseases of pancreas: Secondary | ICD-10-CM | POA: Diagnosis not present

## 2018-09-23 DIAGNOSIS — K838 Other specified diseases of biliary tract: Secondary | ICD-10-CM | POA: Diagnosis not present

## 2018-09-23 DIAGNOSIS — K869 Disease of pancreas, unspecified: Secondary | ICD-10-CM | POA: Diagnosis not present

## 2018-09-23 DIAGNOSIS — K5730 Diverticulosis of large intestine without perforation or abscess without bleeding: Secondary | ICD-10-CM | POA: Diagnosis not present

## 2018-09-23 DIAGNOSIS — R911 Solitary pulmonary nodule: Secondary | ICD-10-CM | POA: Diagnosis not present

## 2018-09-23 DIAGNOSIS — R978 Other abnormal tumor markers: Secondary | ICD-10-CM | POA: Diagnosis not present

## 2018-09-23 DIAGNOSIS — R945 Abnormal results of liver function studies: Secondary | ICD-10-CM | POA: Diagnosis not present

## 2018-09-23 DIAGNOSIS — R918 Other nonspecific abnormal finding of lung field: Secondary | ICD-10-CM | POA: Diagnosis not present

## 2018-09-24 LAB — COMPLETE METABOLIC PANEL WITH GFR
AG RATIO: 1.2 (calc) (ref 1.0–2.5)
ALBUMIN MSPROF: 4.2 g/dL (ref 3.6–5.1)
ALKALINE PHOSPHATASE (APISO): 279 U/L — AB (ref 35–144)
ALT: 296 U/L — ABNORMAL HIGH (ref 9–46)
AST: 111 U/L — AB (ref 10–35)
BILIRUBIN TOTAL: 7.2 mg/dL — AB (ref 0.2–1.2)
BUN: 14 mg/dL (ref 7–25)
CALCIUM: 9.5 mg/dL (ref 8.6–10.3)
CO2: 22 mmol/L (ref 20–32)
Chloride: 105 mmol/L (ref 98–110)
Creat: 0.98 mg/dL (ref 0.70–1.25)
GFR, Est African American: 92 mL/min/{1.73_m2} (ref >=60)
GFR, Est Non African American: 79 mL/min/{1.73_m2} (ref >=60)
GLUCOSE: 135 mg/dL — AB (ref 65–99)
Globulin: 3.5 g/dL (calc) (ref 1.9–3.7)
POTASSIUM: 4.3 mmol/L (ref 3.5–5.3)
Sodium: 136 mmol/L (ref 135–146)
Total Protein: 7.7 g/dL (ref 6.1–8.1)

## 2018-09-24 LAB — CANCER ANTIGEN 19-9: CA 19-9: 62 U/mL — ABNORMAL HIGH (ref <34)

## 2018-09-24 LAB — CBC WITH DIFFERENTIAL/PLATELET
Absolute Monocytes: 857 cells/uL (ref 200–950)
Basophils Absolute: 88 cells/uL (ref 0–200)
Basophils Relative: 1.4 %
Eosinophils Absolute: 139 cells/uL (ref 15–500)
Eosinophils Relative: 2.2 %
HCT: 44.1 % (ref 38.5–50.0)
HEMOGLOBIN: 15.3 g/dL (ref 13.2–17.1)
LYMPHS ABS: 989 {cells}/uL (ref 850–3900)
MCH: 30.3 pg (ref 27.0–33.0)
MCHC: 34.7 g/dL (ref 32.0–36.0)
MCV: 87.3 fL (ref 80.0–100.0)
MONOS PCT: 13.6 %
MPV: 11.5 fL (ref 7.5–12.5)
NEUTROS ABS: 4227 {cells}/uL (ref 1500–7800)
Neutrophils Relative %: 67.1 %
Platelets: 273 10*3/uL (ref 140–400)
RBC: 5.05 10*6/uL (ref 4.20–5.80)
RDW: 13.9 % (ref 11.0–15.0)
Total Lymphocyte: 15.7 %
WBC: 6.3 10*3/uL (ref 3.8–10.8)

## 2018-09-24 LAB — TEST AUTHORIZATION

## 2018-09-24 LAB — LIPASE: LIPASE: 666 U/L — AB (ref 7–60)

## 2018-09-24 LAB — AMYLASE: Amylase: 205 U/L — ABNORMAL HIGH (ref 21–101)

## 2018-09-25 DIAGNOSIS — I491 Atrial premature depolarization: Secondary | ICD-10-CM | POA: Diagnosis not present

## 2018-09-25 DIAGNOSIS — R748 Abnormal levels of other serum enzymes: Secondary | ICD-10-CM | POA: Diagnosis not present

## 2018-09-25 DIAGNOSIS — I4892 Unspecified atrial flutter: Secondary | ICD-10-CM | POA: Diagnosis not present

## 2018-09-25 DIAGNOSIS — Z79899 Other long term (current) drug therapy: Secondary | ICD-10-CM | POA: Diagnosis not present

## 2018-09-25 DIAGNOSIS — K831 Obstruction of bile duct: Secondary | ICD-10-CM | POA: Diagnosis not present

## 2018-09-25 DIAGNOSIS — K838 Other specified diseases of biliary tract: Secondary | ICD-10-CM | POA: Diagnosis not present

## 2018-09-25 DIAGNOSIS — Z9889 Other specified postprocedural states: Secondary | ICD-10-CM | POA: Diagnosis not present

## 2018-09-25 DIAGNOSIS — K8689 Other specified diseases of pancreas: Secondary | ICD-10-CM | POA: Diagnosis not present

## 2018-09-25 DIAGNOSIS — I4891 Unspecified atrial fibrillation: Secondary | ICD-10-CM | POA: Diagnosis not present

## 2018-09-25 DIAGNOSIS — R079 Chest pain, unspecified: Secondary | ICD-10-CM | POA: Diagnosis not present

## 2018-09-25 DIAGNOSIS — R935 Abnormal findings on diagnostic imaging of other abdominal regions, including retroperitoneum: Secondary | ICD-10-CM | POA: Diagnosis not present

## 2018-09-29 DIAGNOSIS — Z01810 Encounter for preprocedural cardiovascular examination: Secondary | ICD-10-CM | POA: Diagnosis not present

## 2018-09-29 DIAGNOSIS — R0609 Other forms of dyspnea: Secondary | ICD-10-CM | POA: Diagnosis not present

## 2018-09-30 DIAGNOSIS — R0609 Other forms of dyspnea: Secondary | ICD-10-CM | POA: Diagnosis not present

## 2018-09-30 DIAGNOSIS — K831 Obstruction of bile duct: Secondary | ICD-10-CM | POA: Diagnosis not present

## 2018-09-30 DIAGNOSIS — K8689 Other specified diseases of pancreas: Secondary | ICD-10-CM | POA: Diagnosis not present

## 2018-10-02 DIAGNOSIS — R911 Solitary pulmonary nodule: Secondary | ICD-10-CM | POA: Diagnosis present

## 2018-10-02 DIAGNOSIS — J90 Pleural effusion, not elsewhere classified: Secondary | ICD-10-CM | POA: Diagnosis not present

## 2018-10-02 DIAGNOSIS — E6609 Other obesity due to excess calories: Secondary | ICD-10-CM | POA: Diagnosis not present

## 2018-10-02 DIAGNOSIS — Z6828 Body mass index (BMI) 28.0-28.9, adult: Secondary | ICD-10-CM | POA: Diagnosis not present

## 2018-10-02 DIAGNOSIS — D3A8 Other benign neuroendocrine tumors: Secondary | ICD-10-CM | POA: Diagnosis not present

## 2018-10-02 DIAGNOSIS — R918 Other nonspecific abnormal finding of lung field: Secondary | ICD-10-CM | POA: Diagnosis not present

## 2018-10-02 DIAGNOSIS — Z6829 Body mass index (BMI) 29.0-29.9, adult: Secondary | ICD-10-CM | POA: Diagnosis not present

## 2018-10-02 DIAGNOSIS — K8689 Other specified diseases of pancreas: Secondary | ICD-10-CM | POA: Diagnosis not present

## 2018-10-02 DIAGNOSIS — C7A8 Other malignant neuroendocrine tumors: Secondary | ICD-10-CM | POA: Diagnosis present

## 2018-10-02 DIAGNOSIS — J9811 Atelectasis: Secondary | ICD-10-CM | POA: Diagnosis not present

## 2018-10-02 DIAGNOSIS — E669 Obesity, unspecified: Secondary | ICD-10-CM | POA: Diagnosis present

## 2018-10-02 DIAGNOSIS — L299 Pruritus, unspecified: Secondary | ICD-10-CM | POA: Diagnosis present

## 2018-10-02 DIAGNOSIS — I498 Other specified cardiac arrhythmias: Secondary | ICD-10-CM | POA: Diagnosis not present

## 2018-10-02 DIAGNOSIS — K831 Obstruction of bile duct: Secondary | ICD-10-CM | POA: Diagnosis present

## 2018-10-02 DIAGNOSIS — I480 Paroxysmal atrial fibrillation: Secondary | ICD-10-CM | POA: Diagnosis not present

## 2018-10-02 DIAGNOSIS — G8918 Other acute postprocedural pain: Secondary | ICD-10-CM | POA: Diagnosis not present

## 2018-10-02 DIAGNOSIS — D72829 Elevated white blood cell count, unspecified: Secondary | ICD-10-CM | POA: Diagnosis not present

## 2018-10-02 DIAGNOSIS — K811 Chronic cholecystitis: Secondary | ICD-10-CM | POA: Diagnosis not present

## 2018-10-02 DIAGNOSIS — Z8249 Family history of ischemic heart disease and other diseases of the circulatory system: Secondary | ICD-10-CM | POA: Diagnosis not present

## 2018-10-02 DIAGNOSIS — E46 Unspecified protein-calorie malnutrition: Secondary | ICD-10-CM | POA: Diagnosis present

## 2018-10-02 DIAGNOSIS — K859 Acute pancreatitis without necrosis or infection, unspecified: Secondary | ICD-10-CM | POA: Diagnosis present

## 2018-10-07 MED ORDER — HEPARIN SODIUM (PORCINE) 5000 UNIT/ML IJ SOLN
5000.00 | INTRAMUSCULAR | Status: DC
Start: 2018-10-08 — End: 2018-10-07

## 2018-10-07 MED ORDER — ONDANSETRON HCL 4 MG/2ML IJ SOLN
4.00 | INTRAMUSCULAR | Status: DC
Start: ? — End: 2018-10-07

## 2018-10-07 MED ORDER — SENNOSIDES-DOCUSATE SODIUM 8.6-50 MG PO TABS
2.00 | ORAL_TABLET | ORAL | Status: DC
Start: 2018-10-15 — End: 2018-10-07

## 2018-10-07 MED ORDER — METOCLOPRAMIDE HCL 5 MG/ML IJ SOLN
10.00 | INTRAMUSCULAR | Status: DC
Start: 2018-10-08 — End: 2018-10-07

## 2018-10-07 MED ORDER — GENERIC EXTERNAL MEDICATION
2.50 | Status: DC
Start: 2018-10-08 — End: 2018-10-07

## 2018-10-07 MED ORDER — GENERIC EXTERNAL MEDICATION
Status: DC
Start: ? — End: 2018-10-07

## 2018-10-07 MED ORDER — KCL IN DEXTROSE-NACL 20-5-0.45 MEQ/L-%-% IV SOLN
75.00 | INTRAVENOUS | Status: DC
Start: ? — End: 2018-10-07

## 2018-10-07 MED ORDER — LIDOCAINE HCL 1 % IJ SOLN
.50 | INTRAMUSCULAR | Status: DC
Start: ? — End: 2018-10-07

## 2018-10-07 MED ORDER — GENERIC EXTERNAL MEDICATION
.25 | Status: DC
Start: ? — End: 2018-10-07

## 2018-10-07 MED ORDER — GENERIC EXTERNAL MEDICATION
40.00 | Status: DC
Start: 2018-10-07 — End: 2018-10-07

## 2018-10-07 MED ORDER — MAGNESIUM HYDROXIDE 400 MG/5ML PO SUSP
30.00 | ORAL | Status: DC
Start: 2018-10-15 — End: 2018-10-07

## 2018-10-15 MED ORDER — MELATONIN 3 MG PO TABS
6.00 | ORAL_TABLET | ORAL | Status: DC
Start: ? — End: 2018-10-15

## 2018-10-15 MED ORDER — METOPROLOL SUCCINATE ER 25 MG PO TB24
50.00 | ORAL_TABLET | ORAL | Status: DC
Start: 2018-10-16 — End: 2018-10-15

## 2018-10-15 MED ORDER — ONDANSETRON HCL 4 MG/2ML IJ SOLN
4.00 | INTRAMUSCULAR | Status: DC
Start: ? — End: 2018-10-15

## 2018-10-15 MED ORDER — LIDOCAINE 5 % EX PTCH
1.00 | MEDICATED_PATCH | CUTANEOUS | Status: DC
Start: 2018-10-15 — End: 2018-10-15

## 2018-10-15 MED ORDER — PANTOPRAZOLE SODIUM 40 MG PO TBEC
40.00 | DELAYED_RELEASE_TABLET | ORAL | Status: DC
Start: 2018-10-15 — End: 2018-10-15

## 2018-10-15 MED ORDER — HYDROMORPHONE HCL 1 MG/ML IJ SOLN
0.50 | INTRAMUSCULAR | Status: DC
Start: ? — End: 2018-10-15

## 2018-10-15 MED ORDER — METOCLOPRAMIDE HCL 10 MG PO TABS
10.00 | ORAL_TABLET | ORAL | Status: DC
Start: 2018-10-15 — End: 2018-10-15

## 2018-10-15 MED ORDER — ACETAMINOPHEN 325 MG PO TABS
650.00 | ORAL_TABLET | ORAL | Status: DC
Start: 2018-10-15 — End: 2018-10-15

## 2018-10-15 MED ORDER — FLUCONAZOLE 200 MG PO TABS
400.00 | ORAL_TABLET | ORAL | Status: DC
Start: 2018-10-16 — End: 2018-10-15

## 2018-10-15 MED ORDER — BENGAY GREASELESS 10-15 % EX CREA
TOPICAL_CREAM | CUTANEOUS | Status: DC
Start: ? — End: 2018-10-15

## 2018-10-15 MED ORDER — ENOXAPARIN SODIUM 40 MG/0.4ML ~~LOC~~ SOLN
40.00 | SUBCUTANEOUS | Status: DC
Start: 2018-10-16 — End: 2018-10-15

## 2018-10-15 MED ORDER — AMOXICILLIN-POT CLAVULANATE 875-125 MG PO TABS
875.00 | ORAL_TABLET | ORAL | Status: DC
Start: 2018-10-15 — End: 2018-10-15

## 2018-10-15 MED ORDER — OXYCODONE HCL 5 MG PO TABS
5.00 | ORAL_TABLET | ORAL | Status: DC
Start: ? — End: 2018-10-15

## 2018-10-17 DIAGNOSIS — T8143XA Infection following a procedure, organ and space surgical site, initial encounter: Secondary | ICD-10-CM | POA: Diagnosis not present

## 2018-10-17 DIAGNOSIS — Z90411 Acquired partial absence of pancreas: Secondary | ICD-10-CM | POA: Diagnosis not present

## 2018-10-17 DIAGNOSIS — R079 Chest pain, unspecified: Secondary | ICD-10-CM | POA: Diagnosis not present

## 2018-10-17 DIAGNOSIS — J90 Pleural effusion, not elsewhere classified: Secondary | ICD-10-CM | POA: Diagnosis not present

## 2018-10-17 DIAGNOSIS — M25512 Pain in left shoulder: Secondary | ICD-10-CM | POA: Diagnosis not present

## 2018-10-17 DIAGNOSIS — J9811 Atelectasis: Secondary | ICD-10-CM | POA: Diagnosis not present

## 2018-10-17 DIAGNOSIS — R1900 Intra-abdominal and pelvic swelling, mass and lump, unspecified site: Secondary | ICD-10-CM | POA: Diagnosis not present

## 2018-10-17 DIAGNOSIS — R1013 Epigastric pain: Secondary | ICD-10-CM | POA: Diagnosis not present

## 2018-10-17 DIAGNOSIS — Z6828 Body mass index (BMI) 28.0-28.9, adult: Secondary | ICD-10-CM | POA: Diagnosis not present

## 2018-10-17 DIAGNOSIS — Z8507 Personal history of malignant neoplasm of pancreas: Secondary | ICD-10-CM | POA: Diagnosis not present

## 2018-10-17 DIAGNOSIS — D72829 Elevated white blood cell count, unspecified: Secondary | ICD-10-CM | POA: Diagnosis not present

## 2018-10-17 DIAGNOSIS — K651 Peritoneal abscess: Secondary | ICD-10-CM | POA: Diagnosis not present

## 2018-10-17 DIAGNOSIS — I4891 Unspecified atrial fibrillation: Secondary | ICD-10-CM | POA: Diagnosis present

## 2018-10-17 DIAGNOSIS — K7689 Other specified diseases of liver: Secondary | ICD-10-CM | POA: Diagnosis present

## 2018-10-17 DIAGNOSIS — E440 Moderate protein-calorie malnutrition: Secondary | ICD-10-CM | POA: Diagnosis not present

## 2018-10-17 DIAGNOSIS — T8142XA Infection following a procedure, deep incisional surgical site, initial encounter: Secondary | ICD-10-CM | POA: Diagnosis not present

## 2018-10-17 DIAGNOSIS — R110 Nausea: Secondary | ICD-10-CM | POA: Diagnosis not present

## 2018-10-17 DIAGNOSIS — A419 Sepsis, unspecified organism: Secondary | ICD-10-CM | POA: Diagnosis not present

## 2018-10-17 DIAGNOSIS — R6520 Severe sepsis without septic shock: Secondary | ICD-10-CM | POA: Diagnosis not present

## 2018-10-17 DIAGNOSIS — I4892 Unspecified atrial flutter: Secondary | ICD-10-CM | POA: Diagnosis not present

## 2018-10-17 DIAGNOSIS — I313 Pericardial effusion (noninflammatory): Secondary | ICD-10-CM | POA: Diagnosis present

## 2018-10-25 MED ORDER — PANTOPRAZOLE SODIUM 40 MG PO TBEC
40.00 | DELAYED_RELEASE_TABLET | ORAL | Status: DC
Start: 2018-10-26 — End: 2018-10-25

## 2018-10-25 MED ORDER — MELATONIN 3 MG PO TABS
6.00 | ORAL_TABLET | ORAL | Status: DC
Start: ? — End: 2018-10-25

## 2018-10-25 MED ORDER — GENERIC EXTERNAL MEDICATION
5.00 | Status: DC
Start: ? — End: 2018-10-25

## 2018-10-25 MED ORDER — ENOXAPARIN SODIUM 40 MG/0.4ML ~~LOC~~ SOLN
40.00 | SUBCUTANEOUS | Status: DC
Start: 2018-10-26 — End: 2018-10-25

## 2018-10-25 MED ORDER — AMOXICILLIN-POT CLAVULANATE 875-125 MG PO TABS
875.00 | ORAL_TABLET | ORAL | Status: DC
Start: 2018-10-25 — End: 2018-10-25

## 2018-10-25 MED ORDER — SENNOSIDES-DOCUSATE SODIUM 8.6-50 MG PO TABS
2.00 | ORAL_TABLET | ORAL | Status: DC
Start: 2018-10-26 — End: 2018-10-25

## 2018-10-25 MED ORDER — FLUCONAZOLE IN SODIUM CHLORIDE 400-0.9 MG/200ML-% IV SOLN
400.00 | INTRAVENOUS | Status: DC
Start: 2018-10-26 — End: 2018-10-25

## 2018-10-25 MED ORDER — ONDANSETRON HCL 4 MG/2ML IJ SOLN
4.00 | INTRAMUSCULAR | Status: DC
Start: ? — End: 2018-10-25

## 2018-10-25 MED ORDER — METOPROLOL SUCCINATE ER 50 MG PO TB24
50.00 | ORAL_TABLET | ORAL | Status: DC
Start: 2018-10-26 — End: 2018-10-25

## 2018-10-25 MED ORDER — ACETAMINOPHEN 325 MG PO TABS
650.00 | ORAL_TABLET | ORAL | Status: DC
Start: 2018-10-26 — End: 2018-10-25

## 2018-10-25 MED ORDER — OXYCODONE HCL 5 MG PO TABS
5.00 | ORAL_TABLET | ORAL | Status: DC
Start: ? — End: 2018-10-25

## 2018-10-28 DIAGNOSIS — C251 Malignant neoplasm of body of pancreas: Secondary | ICD-10-CM | POA: Diagnosis not present

## 2018-10-30 ENCOUNTER — Ambulatory Visit (INDEPENDENT_AMBULATORY_CARE_PROVIDER_SITE_OTHER): Payer: Medicare Other | Admitting: Internal Medicine

## 2018-10-30 ENCOUNTER — Other Ambulatory Visit: Payer: Self-pay

## 2018-10-30 ENCOUNTER — Encounter: Payer: Self-pay | Admitting: Internal Medicine

## 2018-10-30 ENCOUNTER — Telehealth: Payer: Self-pay | Admitting: Internal Medicine

## 2018-10-30 ENCOUNTER — Encounter (INDEPENDENT_AMBULATORY_CARE_PROVIDER_SITE_OTHER): Payer: Self-pay

## 2018-10-30 VITALS — BP 106/74 | HR 74 | Ht 74.0 in | Wt 206.6 lb

## 2018-10-30 DIAGNOSIS — I4892 Unspecified atrial flutter: Secondary | ICD-10-CM | POA: Diagnosis not present

## 2018-10-30 MED ORDER — METOPROLOL SUCCINATE ER 50 MG PO TB24: 50.0000 mg | ORAL_TABLET | Freq: Every day | ORAL | 3 refills | Status: AC

## 2018-10-30 NOTE — Progress Notes (Signed)
HPI 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 The patient developed atrial flutter over 10 years ago and underwent EPS/RFA. He has not had any symptomatic atrial arrhythmias. He underwent surgery at [facility] several weeks ago. His postoperative course was prolonged and complicated. He developed atrial fib/flutter (no available ECG's) and this converted spontaneously. He has developed a wound abscess requiring a wound vac. He is improving. He does not have any additional episodes of heart racing.  No Known Allergies   Current Outpatient Medications  Medication Sig Dispense Refill  . metoCLOPramide (REGLAN) 10 MG tablet Take 1 tablet by mouth every 6 (six) hours.    . metoprolol succinate (TOPROL-XL) 50 MG 24 hr tablet Take 1 tablet (50 mg total) by mouth daily. 90 tablet 3  . ondansetron (ZOFRAN) 4 MG tablet Take 1 tablet (4 mg total) by mouth every 8 (eight) hours as needed for nausea or vomiting. 20 tablet 0  . pantoprazole (PROTONIX) 40 MG tablet Take 1 tablet by mouth 2 (two) times daily.     No current facility-administered medications for this visit.      Past Medical History:  Diagnosis Date  . Abscess after procedure   . Atrial flutter ([place])   . DOE (dyspnea on exertion)   . Elevated LDL cholesterol level 09/21/2013  . Gout   . History of atrial flutter 09/20/2012  . Malignancy ([place])   . Moderate protein-calorie malnutrition ([place])   . Obstructive jaundice 09/21/2018  . Osteoarthritis of left knee 09/21/2013  . Pancreatic mass   . ST elevation   . Status post ablation of atrial flutter 09/20/2012    ROS:   All systems reviewed and negative except as noted in the HPI.   Past Surgical History:  Procedure Laterality Date  . ATRIAL FLUTTER ABLATION  12/2007  . CHOLEDOCHOENTEROSTOMY AND GASTROJEJUNOSTOMY     WITH PANCREATOJEJUNOSTOMY  . ERCP W/ SPHICTEROTOMY     WITH STENTS  .  PANCREAS SURGERY    . PARTIAL GASTRECTOMY    . PROXIMAL SUBTOTAL WITH TOTAL DUODENECTOMY       Family History  Problem Relation Age of Onset  . Cancer Mother   . Hearing loss Father   . Heart attack Father   . Heart attack Brother   . Heart failure Brother   . Cancer Brother        PROSTATE     Social History   Socioeconomic History  . Marital status: Married    Spouse name: Not on file  . Number of children: Not on file  . Years of education: Not on file  . Highest education level: Not on file  Occupational History  . Not on file  Social Needs  . Financial resource strain: Not on file  . Food insecurity:    Worry: Not on file    Inability: Not on file  . Transportation needs:    Medical: Not on file    Non-medical: Not on file  Tobacco Use  . Smoking status: Never Smoker  . Smokeless tobacco: Never Used  Substance and Sexual Activity  . Alcohol use: Yes    Alcohol/week: 4.0 standard drinks    Types: 4 drink(s) per week  . Drug use: No  . Sexual activity: Not on file  Lifestyle  . Physical activity:    Days per week: Not on file    Minutes per session: Not on  file  . Stress: Not on file  Relationships  . Social connections:    Talks on phone: Not on file    Gets together: Not on file    Attends religious service: Not on file    Active member of club or organization: Not on file    Attends meetings of clubs or organizations: Not on file    Relationship status: Not on file  . Intimate partner violence:    Fear of current or ex partner: Not on file    Emotionally abused: Not on file    Physically abused: Not on file    Forced sexual activity: Not on file  Other Topics Concern  . Not on file  Social History Narrative  . Not on file     BP 106/74   Pulse 74   Ht 6\' 2"  (1.88 m)   Wt 206 lb 9.6 oz (93.7 kg)   SpO2 99%   BMI 26.53 kg/m   Physical Exam:  Well appearing NAD HEENT: Unremarkable Neck:  No JVD, no thyromegally Lymphatics:  No  adenopathy Back:  No CVA tenderness Lungs:  Clear with no wheezes HEART:  Regular rate rhythm, no murmurs, no rubs, no clicks Abd:  soft, positive bowel sounds, no organomegally, no rebound, no guarding, wound vac is in placed and sutures are clean and dry.  Ext:  2 plus pulses, no edema, no cyanosis, no clubbing Skin:  No rashes no nodules Neuro:  CN II through XII intact, motor grossly intact  EKG - nsr  Assess/Plan: 1. Post op atrial arrhythmias - I have reviewed the likely clinical course with the patient. I do not recommend additional medications or procedures. I have asked him to check his HR and BP daily over the next 3 months. If his HR is over a 100/min, he is instructed to call our office.  2. Pancreatic CA - he is progressing nicely after his Whipple surgery. He is pending followup with his oncologist to discuss adjuvant therapies.   Mikle Bosworth.D.

## 2018-10-30 NOTE — Telephone Encounter (Signed)
Wife aware

## 2018-10-30 NOTE — Telephone Encounter (Signed)
She said he had surgery on 2/25 and they wanted him to follow up with PCP. She said he has no problems right he is doing good and he has an appointment with cardiology today.

## 2018-10-30 NOTE — Telephone Encounter (Signed)
Evan Thomas [phone]  Evan Thomas called to see if they should reschedule [name]'s appointment on Monday to a later date with everything going on. They do not want to compromise him.  Appointment is for follow up from his recent surgery.

## 2018-10-30 NOTE — Patient Instructions (Addendum)
Medication Instructions:  Your physician recommends that you continue on your current medications as directed. Please refer to the Current Medication list given to you today.  Labwork: None ordered.  Testing/Procedures: None ordered.  Follow-Up: Your physician wants you to follow-up in: 3 months with Dr. Taylor.      Any Other Special Instructions Will Be Listed Below (If Applicable).  If you need a refill on your cardiac medications before your next appointment, please call your pharmacy.   

## 2018-10-30 NOTE — Telephone Encounter (Signed)
We can stay in touch by phone if necessary for now.

## 2018-10-30 NOTE — Telephone Encounter (Signed)
That will be fine to cancel.

## 2018-11-02 ENCOUNTER — Ambulatory Visit: Payer: Medicare Other | Admitting: Internal Medicine

## 2018-11-04 DIAGNOSIS — C251 Malignant neoplasm of body of pancreas: Secondary | ICD-10-CM | POA: Diagnosis not present

## 2018-11-04 DIAGNOSIS — C7A8 Other malignant neuroendocrine tumors: Secondary | ICD-10-CM | POA: Diagnosis not present

## 2018-11-17 ENCOUNTER — Encounter: Payer: Self-pay | Admitting: Internal Medicine

## 2018-11-17 ENCOUNTER — Ambulatory Visit (INDEPENDENT_AMBULATORY_CARE_PROVIDER_SITE_OTHER): Payer: Medicare Other | Admitting: Internal Medicine

## 2018-11-17 VITALS — Temp 98.1°F

## 2018-11-17 DIAGNOSIS — D3A8 Other benign neuroendocrine tumors: Secondary | ICD-10-CM | POA: Diagnosis not present

## 2018-11-17 DIAGNOSIS — R1084 Generalized abdominal pain: Secondary | ICD-10-CM | POA: Diagnosis not present

## 2018-11-17 DIAGNOSIS — R8290 Unspecified abnormal findings in urine: Secondary | ICD-10-CM

## 2018-11-17 DIAGNOSIS — R1030 Lower abdominal pain, unspecified: Secondary | ICD-10-CM | POA: Diagnosis not present

## 2018-11-17 DIAGNOSIS — R319 Hematuria, unspecified: Secondary | ICD-10-CM | POA: Diagnosis not present

## 2018-11-17 LAB — BASIC METABOLIC PANEL
BUN: 13 mg/dL (ref 7–25)
CO2: 25 mmol/L (ref 20–32)
Calcium: 9 mg/dL (ref 8.6–10.3)
Chloride: 101 mmol/L (ref 98–110)
Creat: 1.09 mg/dL (ref 0.70–1.25)
Glucose, Bld: 130 mg/dL — ABNORMAL HIGH (ref 65–99)
POTASSIUM: 3.9 mmol/L (ref 3.5–5.3)
Sodium: 135 mmol/L (ref 135–146)

## 2018-11-17 LAB — POCT URINALYSIS DIPSTICK
Appearance: NEGATIVE
Bilirubin, UA: NEGATIVE
GLUCOSE UA: NEGATIVE
Ketones, UA: NEGATIVE
Leukocytes, UA: NEGATIVE
Nitrite, UA: NEGATIVE
Odor: NEGATIVE
Protein, UA: POSITIVE — AB
Spec Grav, UA: 1.02 (ref 1.010–1.025)
Urobilinogen, UA: 0.2 E.U./dL
pH, UA: 6 (ref 5.0–8.0)

## 2018-11-17 MED ORDER — TAMSULOSIN HCL 0.4 MG PO CAPS: 0.4000 mg | ORAL_CAPSULE | Freq: Every day | ORAL | 0 refills | Status: AC

## 2018-11-17 MED ORDER — CIPROFLOXACIN HCL 500 MG PO TABS: 500.0000 mg | ORAL_TABLET | Freq: Two times a day (BID) | ORAL | 0 refills | Status: AC

## 2018-11-17 NOTE — Addendum Note (Signed)
Addended by: Mady Haagensen on: 11/17/2018 05:22 PM   Modules accepted: Orders

## 2018-11-17 NOTE — Progress Notes (Signed)
   Subjective:    Patient ID: Evan Thomas, male    DOB: 1951/04/01, 68 y.o.   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  No fever or shaking chills.  No nausea or vomiting.  No difficulty urinating.  No history of kidney stones and CT scan done in February did not comment about any kidney stones.  He did have a catheter during his hospitalization at [facility].  History of atrial flutter seen by Dr. Lovena Le.  Currently not on any anticoagulation.  He is on metoprolol.  Only other meds he is taking are Reglan and Protonix at present time.  Urine dipstick shows occult blood.  Will be sent for microscopic and urine culture.    Review of Systems see above     Objective:   Physical Exam He is afebrile.  No CVA tenderness.  He looks remarkably well.  Basic metabolic panel drawn.  Well-healed abdominal incision. Minimal exam due to Coronavirus outbreak      Assessment & Plan:  Status post Whipple procedure for neuroendocrine tumor of the pancreas at [facility] February 2020 with urinary catheter placement at that time  Lower abdominal pain and left flank pain onset today-?  Kidney stone versus UTI  Plan: Culture sent.  He will have CT of the abdomen and pelvis tomorrow.  Start Flomax 0.4 mg this evening and Cipro 500 mg twice daily for 3 days pending culture.  25 minutes spent with patient and on phone with wife.  Basic metabolic panel drawn.

## 2018-11-17 NOTE — Patient Instructions (Addendum)
To have CT with contrast tomorrow. Start Flomax and Cipro tonight, B-met pending

## 2018-11-18 LAB — URINE CULTURE
MICRO NUMBER:: 365937
Result:: NO GROWTH
SPECIMEN QUALITY: ADEQUATE

## 2018-11-18 LAB — URINALYSIS, MICROSCOPIC ONLY
Bacteria, UA: NONE SEEN /HPF
Hyaline Cast: NONE SEEN /LPF
Squamous Epithelial / HPF: NONE SEEN /HPF (ref <=5)

## 2018-11-18 NOTE — Addendum Note (Signed)
Addended by: Mady Haagensen on: 11/18/2018 09:45 AM   Modules accepted: Orders

## 2018-11-19 ENCOUNTER — Other Ambulatory Visit: Payer: Self-pay

## 2018-11-19 ENCOUNTER — Ambulatory Visit
Admission: RE | Admit: 2018-11-19 | Discharge: 2018-11-19 | Disposition: A | Discharge status: Home / Self Care | Payer: Medicare Other | Source: Ambulatory Visit | Attending: Internal Medicine | Admitting: Internal Medicine

## 2018-11-19 DIAGNOSIS — R1084 Generalized abdominal pain: Secondary | ICD-10-CM

## 2018-11-19 DIAGNOSIS — D3A8 Other benign neuroendocrine tumors: Secondary | ICD-10-CM

## 2018-11-19 DIAGNOSIS — R109 Unspecified abdominal pain: Secondary | ICD-10-CM | POA: Diagnosis not present

## 2018-11-19 DIAGNOSIS — R319 Hematuria, unspecified: Secondary | ICD-10-CM

## 2018-11-19 DIAGNOSIS — R1030 Lower abdominal pain, unspecified: Secondary | ICD-10-CM

## 2018-11-19 MED ORDER — IOPAMIDOL (ISOVUE-300) INJECTION 61%
100.0000 mL | Freq: Once | INTRAVENOUS | Status: AC | PRN
  Administered 2018-11-19: 100 mL via INTRAVENOUS

## 2018-11-24 DIAGNOSIS — C7A8 Other malignant neuroendocrine tumors: Secondary | ICD-10-CM | POA: Diagnosis not present

## 2018-12-15 ENCOUNTER — Other Ambulatory Visit: Payer: Self-pay

## 2018-12-15 MED ORDER — ALPRAZOLAM 0.5 MG PO TABS: 0.5000 mg | ORAL_TABLET | Freq: Two times a day (BID) | ORAL | 2 refills | Status: AC | PRN

## 2019-02-15 ENCOUNTER — Telehealth: Payer: Self-pay | Admitting: Internal Medicine

## 2019-02-15 NOTE — Telephone Encounter (Signed)
New Message    Spoke with Wife and pt will call back to [place] Questions

## 2019-02-15 NOTE — Telephone Encounter (Signed)
Follow up ° ° °Patient is returning your call. Please call. ° ° ° °

## 2019-02-16 ENCOUNTER — Ambulatory Visit: Payer: Medicare Other | Admitting: Internal Medicine

## 2019-03-29 DIAGNOSIS — Z8507 Personal history of malignant neoplasm of pancreas: Secondary | ICD-10-CM | POA: Diagnosis not present

## 2019-03-29 DIAGNOSIS — K769 Liver disease, unspecified: Secondary | ICD-10-CM | POA: Diagnosis not present

## 2019-03-29 DIAGNOSIS — Z08 Encounter for follow-up examination after completed treatment for malignant neoplasm: Secondary | ICD-10-CM | POA: Diagnosis not present

## 2019-03-29 DIAGNOSIS — Z9889 Other specified postprocedural states: Secondary | ICD-10-CM | POA: Diagnosis not present

## 2019-03-29 DIAGNOSIS — D3A8 Other benign neuroendocrine tumors: Secondary | ICD-10-CM | POA: Diagnosis not present

## 2019-04-10 IMAGING — CR DG CHEST 2V
2 series · 2 of 2 positions shown · non-contrast
Comparison: 01/01/2008

CLINICAL DATA: Persistent cough

EXAM:
CHEST - 2 VIEW

[w chest pa]
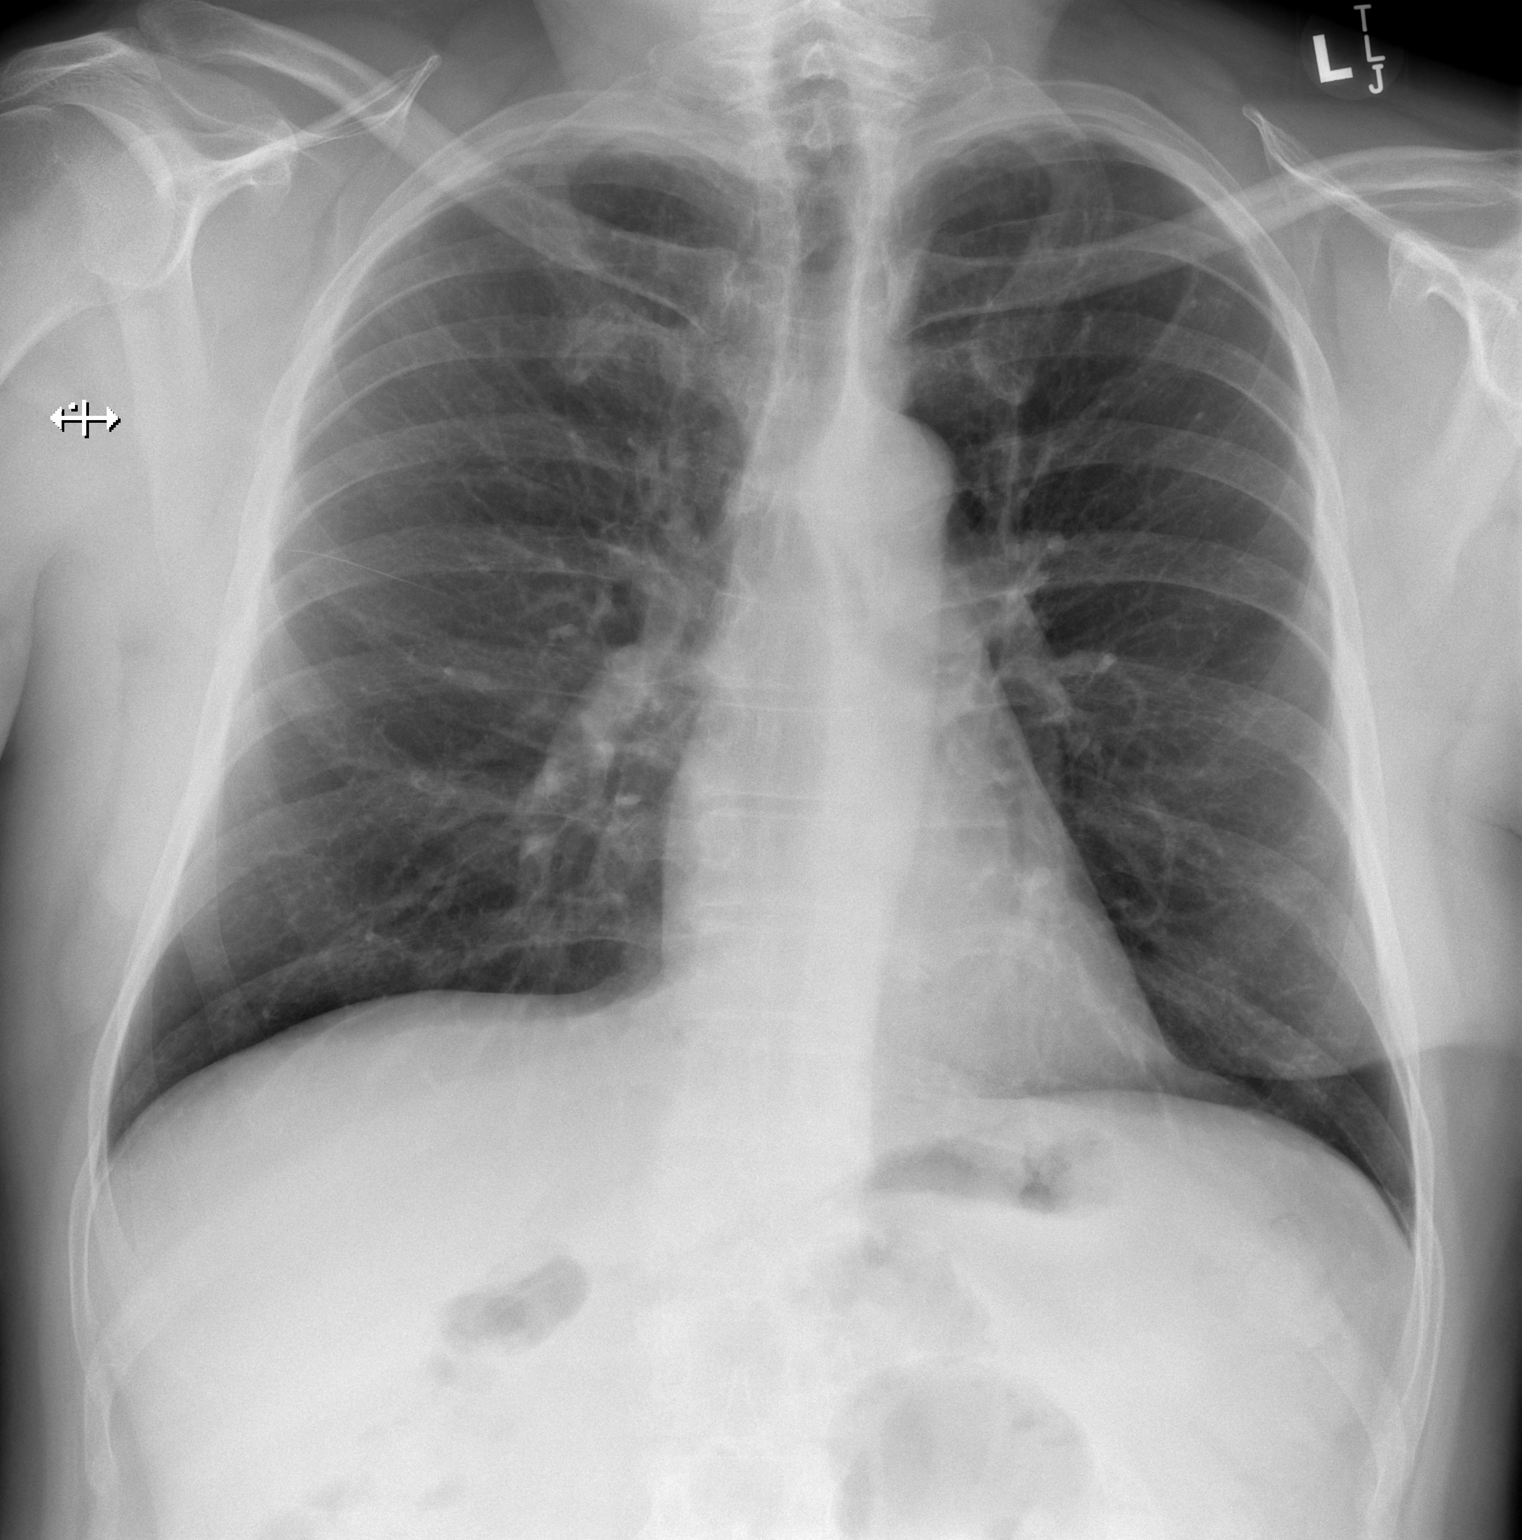

[w chest lat]
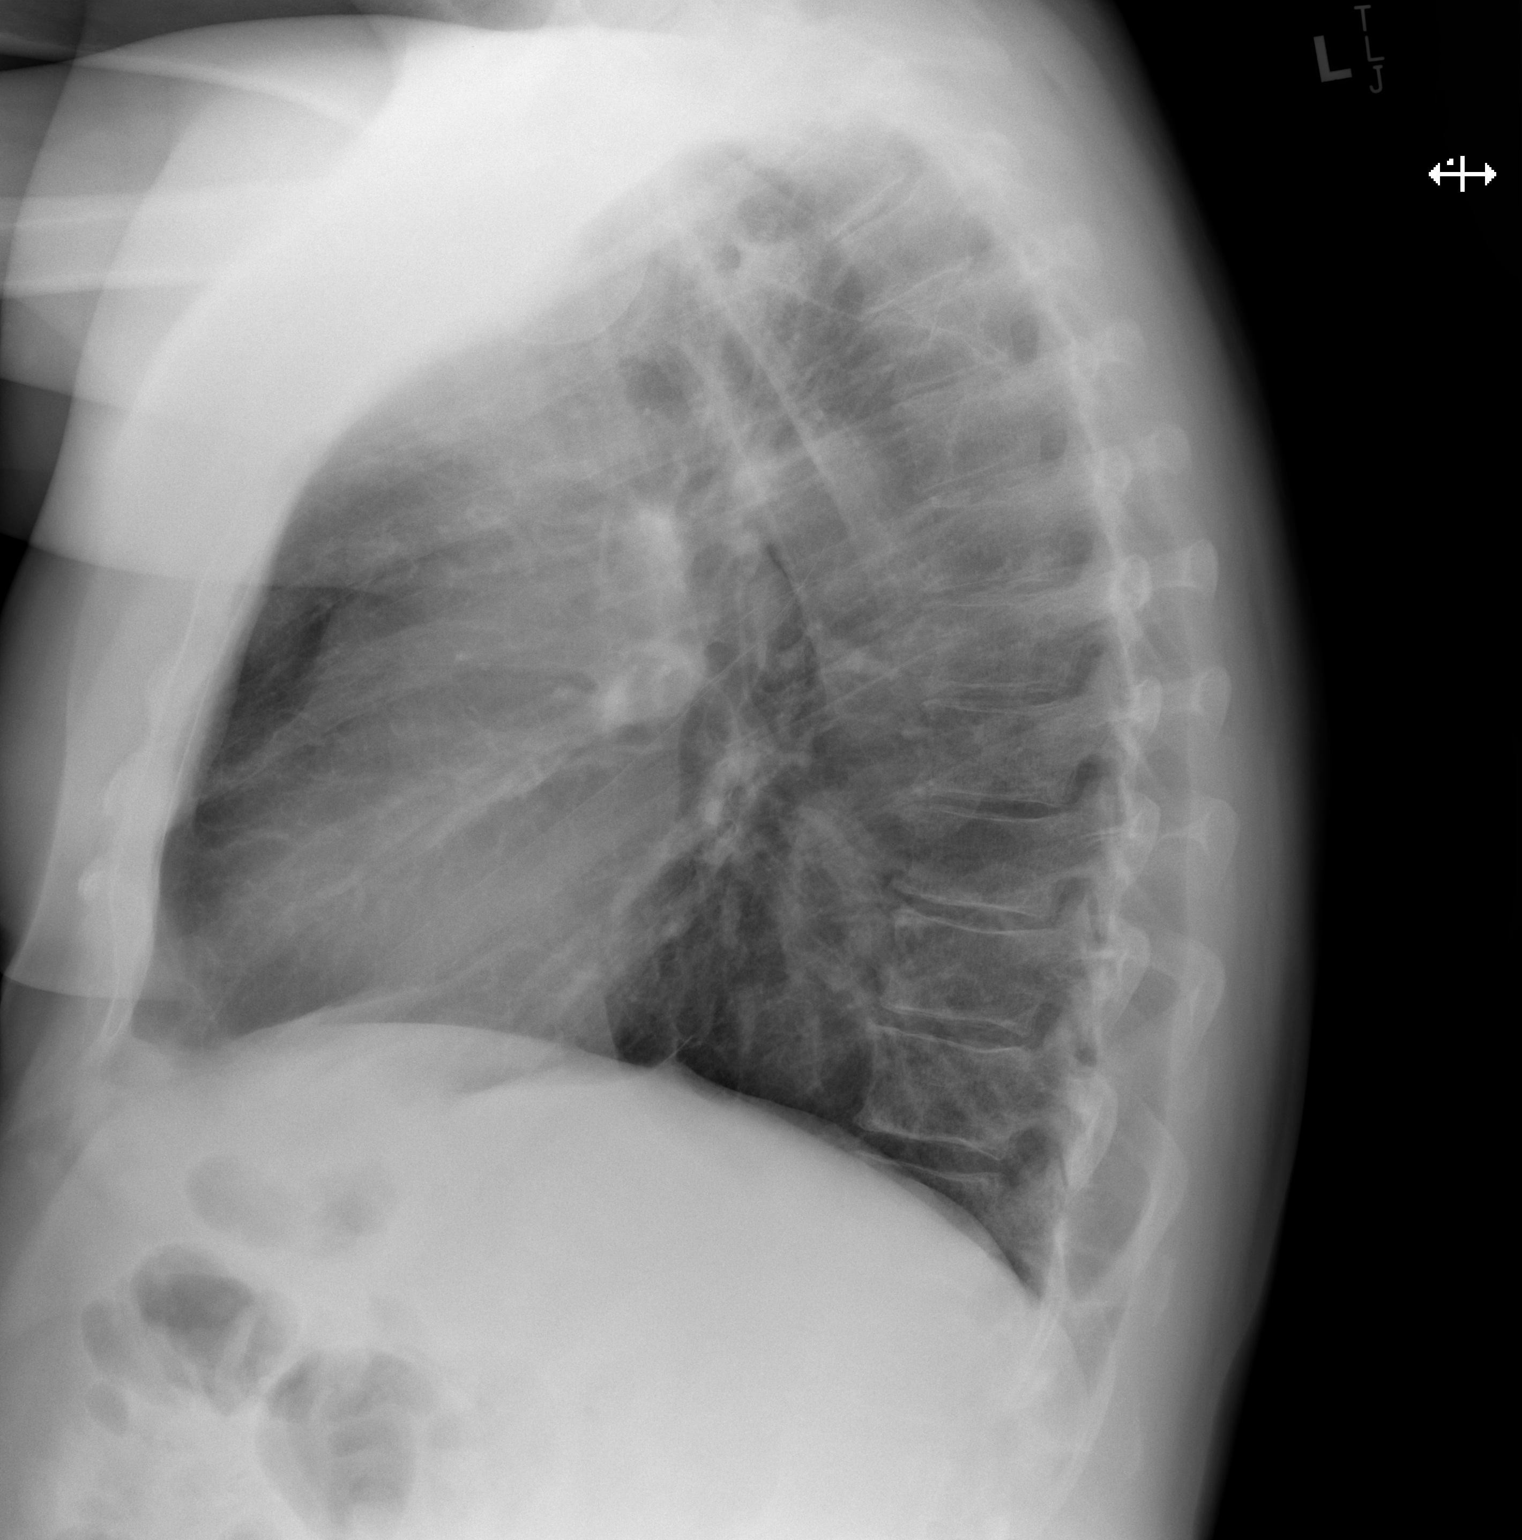

[2 of 2 positions shown; findings below may reference images not displayed]

FINDINGS: The heart size and mediastinal contours are within normal limits.
Both lungs are clear. The visualized skeletal structures are
unremarkable.
IMPRESSION: No active cardiopulmonary disease.

## 2020-06-21 ENCOUNTER — Telehealth: Payer: Self-pay | Admitting: Internal Medicine

## 2020-06-21 NOTE — Telephone Encounter (Signed)
Removed DR Renold Genta as PCP, Pt moved to [place] pre COVID and is not coming back at this time
